# Patient Record
Sex: Female | Born: 1960 | Race: Asian | Hispanic: No | Marital: Married | Smoking: Never smoker
Health system: Southern US, Community
[De-identification: ages and names within clinical notes are randomized; demographics above are authoritative.]

## PROBLEM LIST (undated history)

## (undated) ENCOUNTER — Emergency Department (HOSPITAL_COMMUNITY): Admission: EM | Payer: Self-pay | Source: Home / Self Care

## (undated) DIAGNOSIS — R109 Unspecified abdominal pain: Secondary | ICD-10-CM

## (undated) DIAGNOSIS — D649 Anemia, unspecified: Secondary | ICD-10-CM

## (undated) DIAGNOSIS — R7301 Impaired fasting glucose: Secondary | ICD-10-CM

## (undated) DIAGNOSIS — R0602 Shortness of breath: Secondary | ICD-10-CM

## (undated) DIAGNOSIS — G8929 Other chronic pain: Secondary | ICD-10-CM

## (undated) DIAGNOSIS — E785 Hyperlipidemia, unspecified: Secondary | ICD-10-CM

## (undated) DIAGNOSIS — Z01419 Encounter for gynecological examination (general) (routine) without abnormal findings: Secondary | ICD-10-CM

## (undated) HISTORY — DX: Impaired fasting glucose: R73.01

## (undated) HISTORY — DX: Other chronic pain: G89.29

## (undated) HISTORY — DX: Hyperlipidemia, unspecified: E78.5

## (undated) HISTORY — DX: Anemia, unspecified: D64.9

## (undated) HISTORY — DX: Unspecified abdominal pain: R10.9

## (undated) HISTORY — DX: Encounter for gynecological examination (general) (routine) without abnormal findings: Z01.419

## (undated) HISTORY — DX: Shortness of breath: R06.02

---

## 2003-06-03 ENCOUNTER — Emergency Department (HOSPITAL_COMMUNITY): Admission: EM | Admit: 2003-06-03 | Discharge: 2003-06-03 | Payer: Self-pay | Admitting: Emergency Medicine

## 2003-10-19 ENCOUNTER — Emergency Department (HOSPITAL_COMMUNITY): Admission: EM | Admit: 2003-10-19 | Discharge: 2003-10-19 | Payer: Self-pay | Admitting: Emergency Medicine

## 2003-10-31 ENCOUNTER — Emergency Department (HOSPITAL_COMMUNITY): Admission: EM | Admit: 2003-10-31 | Discharge: 2003-10-31 | Payer: Self-pay | Admitting: Emergency Medicine

## 2005-03-26 ENCOUNTER — Other Ambulatory Visit: Admission: RE | Admit: 2005-03-26 | Discharge: 2005-03-26 | Payer: Self-pay | Admitting: Internal Medicine

## 2005-12-12 ENCOUNTER — Emergency Department (HOSPITAL_COMMUNITY): Admission: EM | Admit: 2005-12-12 | Discharge: 2005-12-12 | Payer: Self-pay | Admitting: Emergency Medicine

## 2007-07-07 ENCOUNTER — Emergency Department (HOSPITAL_COMMUNITY): Admission: EM | Admit: 2007-07-07 | Discharge: 2007-07-07 | Payer: Self-pay | Admitting: Emergency Medicine

## 2007-07-11 ENCOUNTER — Emergency Department (HOSPITAL_COMMUNITY): Admission: EM | Admit: 2007-07-11 | Discharge: 2007-07-11 | Payer: Self-pay | Admitting: Emergency Medicine

## 2008-05-22 ENCOUNTER — Emergency Department (HOSPITAL_COMMUNITY): Admission: EM | Admit: 2008-05-22 | Discharge: 2008-05-22 | Payer: Self-pay | Admitting: Emergency Medicine

## 2008-10-23 ENCOUNTER — Emergency Department (HOSPITAL_COMMUNITY): Admission: EM | Admit: 2008-10-23 | Discharge: 2008-10-24 | Payer: Self-pay | Admitting: Emergency Medicine

## 2010-01-24 ENCOUNTER — Emergency Department (HOSPITAL_COMMUNITY)
Admission: EM | Admit: 2010-01-24 | Discharge: 2010-01-24 | Payer: Self-pay | Source: Home / Self Care | Admitting: Emergency Medicine

## 2010-06-09 LAB — COMPREHENSIVE METABOLIC PANEL
Albumin: 3.6 g/dL (ref 3.5–5.2)
BUN: 16 mg/dL (ref 6–23)
Calcium: 8.9 mg/dL (ref 8.4–10.5)
Creatinine, Ser: 0.66 mg/dL (ref 0.4–1.2)
Potassium: 3.7 mEq/L (ref 3.5–5.1)
Total Protein: 7.5 g/dL (ref 6.0–8.3)

## 2010-06-09 LAB — CBC
HCT: 36.7 % (ref 36.0–46.0)
MCHC: 34.4 g/dL (ref 30.0–36.0)
MCV: 83.5 fL (ref 78.0–100.0)
Platelets: 257 10*3/uL (ref 150–400)
RDW: 14.3 % (ref 11.5–15.5)

## 2010-06-09 LAB — URINE MICROSCOPIC-ADD ON

## 2010-06-09 LAB — DIFFERENTIAL
Lymphocytes Relative: 40 % (ref 12–46)
Lymphs Abs: 3.9 10*3/uL (ref 0.7–4.0)
Monocytes Absolute: 0.5 10*3/uL (ref 0.1–1.0)
Monocytes Relative: 5 % (ref 3–12)
Neutro Abs: 4.8 10*3/uL (ref 1.7–7.7)

## 2010-06-09 LAB — URINALYSIS, ROUTINE W REFLEX MICROSCOPIC
Specific Gravity, Urine: 1.029 (ref 1.005–1.030)
Urobilinogen, UA: 0.2 mg/dL (ref 0.0–1.0)

## 2010-06-09 LAB — WET PREP, GENITAL
Trich, Wet Prep: NONE SEEN
Yeast Wet Prep HPF POC: NONE SEEN

## 2010-06-28 ENCOUNTER — Institutional Professional Consult (permissible substitution): Payer: Self-pay | Admitting: Internal Medicine

## 2010-07-22 ENCOUNTER — Emergency Department (HOSPITAL_COMMUNITY)
Admission: EM | Admit: 2010-07-22 | Discharge: 2010-07-22 | Disposition: A | Discharge status: Home / Self Care | Payer: Commercial Managed Care - PPO | Attending: Emergency Medicine | Admitting: Emergency Medicine

## 2010-07-22 DIAGNOSIS — R059 Cough, unspecified: Secondary | ICD-10-CM | POA: Insufficient documentation

## 2010-07-22 DIAGNOSIS — K219 Gastro-esophageal reflux disease without esophagitis: Secondary | ICD-10-CM | POA: Insufficient documentation

## 2010-07-22 DIAGNOSIS — R1013 Epigastric pain: Secondary | ICD-10-CM | POA: Insufficient documentation

## 2010-07-22 DIAGNOSIS — R05 Cough: Secondary | ICD-10-CM | POA: Insufficient documentation

## 2010-07-22 LAB — URINALYSIS, ROUTINE W REFLEX MICROSCOPIC
Bilirubin Urine: NEGATIVE
Glucose, UA: NEGATIVE mg/dL
Ketones, ur: NEGATIVE mg/dL
Nitrite: NEGATIVE
Protein, ur: NEGATIVE mg/dL
Specific Gravity, Urine: 1.012 (ref 1.005–1.030)
Urobilinogen, UA: 0.2 mg/dL (ref 0.0–1.0)
pH: 7 (ref 5.0–8.0)

## 2010-07-22 LAB — URINE MICROSCOPIC-ADD ON

## 2010-07-23 ENCOUNTER — Encounter: Payer: Self-pay | Admitting: Pulmonary Disease

## 2010-07-31 ENCOUNTER — Encounter: Payer: Self-pay | Admitting: Pulmonary Disease

## 2010-07-31 ENCOUNTER — Ambulatory Visit (INDEPENDENT_AMBULATORY_CARE_PROVIDER_SITE_OTHER): Payer: Commercial Managed Care - PPO | Admitting: Pulmonary Disease

## 2010-07-31 VITALS — BP 110/78 | HR 84 | Temp 98.4°F | Ht 62.0 in | Wt 150.4 lb

## 2010-07-31 DIAGNOSIS — R042 Hemoptysis: Secondary | ICD-10-CM | POA: Insufficient documentation

## 2010-07-31 NOTE — Patient Instructions (Signed)
Would like you to monitor your coughing up blood over the next 2 weeks.  Write down each day that you cough up blood, and call me at the end of two weeks to report back.  We can then make a decision about where to go from here. Please call me sooner than 2 weeks if you cough up increasing quantities of blood.

## 2010-07-31 NOTE — Progress Notes (Signed)
  Subjective:    Patient ID: Michelle Barr, female    DOB: 09/22/1960, 50 y.o.   MRN: [id number]  HPI The pt is a 50y/o asian female who I have been asked to see for hemoptysis.  This started approx 2mos ago, and consists of dark red blood that is quarter sized at its worse.  She is unsure if this is getting less frequent or not.  She describes a fullness developing in her throat, and then she coughs up the blood.  She is unsure if she has postnasal drip, but denies epistaxis.  She has had a negative cxr, and her PPD recently was negative as well.   She denies any worsening sob, has good appetite, and has not had weight loss.  She did have a fall a few mos ago where she hit her sternum on a gate.  ?when the hemoptysis started?.     Review of Systems  Constitutional: Negative for fever and unexpected weight change.  HENT: Positive for nosebleeds, sore throat and dental problem. Negative for ear pain, congestion, rhinorrhea, sneezing, trouble swallowing, postnasal drip and sinus pressure.   Eyes: Negative for redness and itching.  Respiratory: Positive for cough and shortness of breath. Negative for chest tightness and wheezing.   Cardiovascular: Positive for chest pain. Negative for palpitations and leg swelling.  Gastrointestinal: Negative for nausea and vomiting.  Genitourinary: Negative for dysuria.  Musculoskeletal: Negative for joint swelling.  Skin: Negative for rash.  Neurological: Negative for headaches.  Hematological: Does not bruise/bleed easily.  Psychiatric/Behavioral: Negative for dysphoric mood. The patient is not nervous/anxious.        Objective:   Physical Exam Constitutional:  Well developed, no acute distress  HENT:  Nares patent without discharge, no bleeding source noted.   Oropharynx without exudate, palate and uvula are normal, no bleeding source  Eyes:  Perrla, eomi, no scleral icterus  Neck:  No JVD, no TMG  Cardiovascular:  Normal rate, regular rhythm, no rubs or  gallops.  No murmurs        Intact distal pulses  Pulmonary :  Normal breath sounds, no stridor or respiratory distress   No rales, rhonchi, or wheezing  Abdominal:  Soft, nondistended, bowel sounds present.  No tenderness noted.   Musculoskeletal:  No lower extremity edema noted.  Lymph Nodes:  No cervical lymphadenopathy noted  Skin:  No cyanosis noted  Neurologic:  Alert, appropriate, moves all 4 extremities without obvious deficit.         Assessment & Plan:

## 2010-07-31 NOTE — Assessment & Plan Note (Addendum)
The pt has has hemoptysis of dark red blood in varying quantities and on various days for the last 2 mos.  By her description, this is likely coming from the upper rather than lower airway.  She is also in a low risk group with clear cxr, negative PPD, and never smoker.  She did have a fall, which raises the question of pulmonary contusion, but her cxr was not abnormal.  At this point, would like to monitor her for another few weeks to see if this is getting better or not.  She is not able to understand this concept due to language barrier.  She will keep a diary for me.  If continues to be an issue, would check HRCT and do ENT upper airway evaluation.  The pt is agreeable to this approach.

## 2010-11-09 ENCOUNTER — Emergency Department (HOSPITAL_COMMUNITY): Payer: Commercial Managed Care - PPO

## 2010-11-09 ENCOUNTER — Emergency Department (HOSPITAL_COMMUNITY)
Admission: EM | Admit: 2010-11-09 | Discharge: 2010-11-09 | Disposition: A | Discharge status: Home / Self Care | Payer: Commercial Managed Care - PPO | Attending: Emergency Medicine | Admitting: Emergency Medicine

## 2010-11-09 DIAGNOSIS — R61 Generalized hyperhidrosis: Secondary | ICD-10-CM | POA: Insufficient documentation

## 2010-11-09 DIAGNOSIS — R55 Syncope and collapse: Secondary | ICD-10-CM | POA: Insufficient documentation

## 2010-11-09 DIAGNOSIS — R42 Dizziness and giddiness: Secondary | ICD-10-CM | POA: Insufficient documentation

## 2010-11-09 DIAGNOSIS — R232 Flushing: Secondary | ICD-10-CM | POA: Insufficient documentation

## 2010-11-09 DIAGNOSIS — R5381 Other malaise: Secondary | ICD-10-CM | POA: Insufficient documentation

## 2010-11-09 DIAGNOSIS — R1032 Left lower quadrant pain: Secondary | ICD-10-CM | POA: Insufficient documentation

## 2010-11-09 LAB — COMPREHENSIVE METABOLIC PANEL
ALT: 12 U/L (ref 0–35)
AST: 18 U/L (ref 0–37)
Albumin: 3.5 g/dL (ref 3.5–5.2)
CO2: 30 mEq/L (ref 19–32)
Calcium: 9.1 mg/dL (ref 8.4–10.5)
Creatinine, Ser: 0.59 mg/dL (ref 0.50–1.10)
GFR calc non Af Amer: >60 mL/min (ref >60)
Sodium: 139 mEq/L (ref 135–145)
Total Protein: 7.2 g/dL (ref 6.0–8.3)

## 2010-11-09 LAB — DIFFERENTIAL
Basophils Absolute: 0 10*3/uL (ref 0.0–0.1)
Eosinophils Relative: 3 % (ref 0–5)
Lymphocytes Relative: 30 % (ref 12–46)
Lymphs Abs: 2.4 10*3/uL (ref 0.7–4.0)
Monocytes Absolute: 0.4 10*3/uL (ref 0.1–1.0)
Neutro Abs: 5.1 10*3/uL (ref 1.7–7.7)

## 2010-11-09 LAB — URINALYSIS, ROUTINE W REFLEX MICROSCOPIC
Bilirubin Urine: NEGATIVE
Glucose, UA: NEGATIVE mg/dL
Specific Gravity, Urine: 1.023 (ref 1.005–1.030)
Urobilinogen, UA: 0.2 mg/dL (ref 0.0–1.0)
pH: 6 (ref 5.0–8.0)

## 2010-11-09 LAB — URINE MICROSCOPIC-ADD ON

## 2010-11-09 LAB — CBC
HCT: 35.3 % — ABNORMAL LOW (ref 36.0–46.0)
Hemoglobin: 12.2 g/dL (ref 12.0–15.0)
MCV: 81.3 fL (ref 78.0–100.0)
RDW: 13.4 % (ref 11.5–15.5)
WBC: 8.1 10*3/uL (ref 4.0–10.5)

## 2010-11-09 LAB — POCT I-STAT TROPONIN I

## 2010-11-09 MED ORDER — IOHEXOL 300 MG/ML  SOLN
80.0000 mL | Freq: Once | INTRAMUSCULAR | Status: AC | PRN
  Administered 2010-11-09: 80 mL via INTRAVENOUS

## 2012-07-01 ENCOUNTER — Emergency Department (HOSPITAL_COMMUNITY)
Admission: EM | Admit: 2012-07-01 | Discharge: 2012-07-01 | Disposition: A | Discharge status: Home / Self Care | Payer: Commercial Managed Care - PPO | Attending: Emergency Medicine | Admitting: Emergency Medicine

## 2012-07-01 ENCOUNTER — Encounter (HOSPITAL_COMMUNITY): Payer: Self-pay | Admitting: *Deleted

## 2012-07-01 ENCOUNTER — Emergency Department (HOSPITAL_COMMUNITY): Payer: Commercial Managed Care - PPO

## 2012-07-01 DIAGNOSIS — R079 Chest pain, unspecified: Secondary | ICD-10-CM

## 2012-07-01 LAB — CBC WITH DIFFERENTIAL/PLATELET
Basophils Relative: 0 % (ref 0–1)
Eosinophils Absolute: 0.4 10*3/uL (ref 0.0–0.7)
Hemoglobin: 12.8 g/dL (ref 12.0–15.0)
Lymphs Abs: 3 10*3/uL (ref 0.7–4.0)
MCH: 28.1 pg (ref 26.0–34.0)
Monocytes Relative: 6 % (ref 3–12)
Neutro Abs: 4 10*3/uL (ref 1.7–7.7)
Neutrophils Relative %: 51 % (ref 43–77)
Platelets: 268 10*3/uL (ref 150–400)
RBC: 4.55 MIL/uL (ref 3.87–5.11)
WBC: 7.8 10*3/uL (ref 4.0–10.5)

## 2012-07-01 LAB — COMPREHENSIVE METABOLIC PANEL
ALT: 18 U/L (ref 0–35)
Albumin: 3.2 g/dL — ABNORMAL LOW (ref 3.5–5.2)
Alkaline Phosphatase: 63 U/L (ref 39–117)
BUN: 15 mg/dL (ref 6–23)
Chloride: 105 mEq/L (ref 96–112)
Glucose, Bld: 104 mg/dL — ABNORMAL HIGH (ref 70–99)
Potassium: 3.8 mEq/L (ref 3.5–5.1)
Sodium: 139 mEq/L (ref 135–145)
Total Bilirubin: 0.2 mg/dL — ABNORMAL LOW (ref 0.3–1.2)
Total Protein: 7.1 g/dL (ref 6.0–8.3)

## 2012-07-01 LAB — POCT I-STAT TROPONIN I: Troponin i, poc: 0 ng/mL (ref 0.00–0.08)

## 2012-07-01 MED ORDER — FAMOTIDINE 20 MG PO TABS
20.0000 mg | ORAL_TABLET | Freq: Once | ORAL | Status: AC
  Administered 2012-07-01: 20 mg via ORAL
  Filled 2012-07-01: qty 1

## 2012-07-01 MED ORDER — GI COCKTAIL ~~LOC~~
30.0000 mL | Freq: Once | ORAL | Status: AC
  Administered 2012-07-01: 30 mL via ORAL
  Filled 2012-07-01: qty 30

## 2012-07-01 MED ORDER — MORPHINE SULFATE 4 MG/ML IJ SOLN
4.0000 mg | Freq: Once | INTRAMUSCULAR | Status: AC
  Administered 2012-07-01: 4 mg via INTRAVENOUS
  Filled 2012-07-01: qty 1

## 2012-07-01 MED ORDER — ONDANSETRON HCL 4 MG/2ML IJ SOLN
4.0000 mg | Freq: Once | INTRAMUSCULAR | Status: AC
Start: 2012-07-01 — End: 2012-07-01
  Administered 2012-07-01: 4 mg via INTRAVENOUS
  Filled 2012-07-01: qty 2

## 2012-07-01 NOTE — ED Provider Notes (Signed)
History     CSN: [id number]  Arrival date & time 07/01/12  0455   First MD Initiated Contact with Patient 07/01/12 [phone]      Chief Complaint  Patient presents with  . Chest Pain    (Consider location/radiation/quality/duration/timing/severity/associated sxs/prior treatment) Patient is a 52 y.o. female presenting with chest pain. The history is provided by the patient.  Chest Pain Associated symptoms: no abdominal pain, no back pain, no cough, no fever, no headache, no nausea, no palpitations, no shortness of breath and not vomiting   pt c/o lower sternal/midline cp for the past day, occasional feels in back/mid scapular area. Dull. Constant.  Mild. At rest. No relation to activity or exertion. Not positional. No sob. No nv, no diaphoresis. Not pleuritic. No cough or hemoptysis. No fever or chills. No hx heart or lung disease. No fam hx cad. No recent surgery, trauma, immobility/travel. No leg pain or swelling. No hx dvt or pe. No hx gerd or gallstones.     History reviewed. No pertinent past medical history.  History reviewed. No pertinent past surgical history.  Family History  Problem Relation Age of Onset  . Heart disease Father     History  Substance Use Topics  . Smoking status: Never Smoker   . Smokeless tobacco: Not on file  . Alcohol Use: No    OB History   Grav Para Term Preterm Abortions TAB SAB Ect Mult Living                  Review of Systems  Constitutional: Negative for fever and chills.  HENT: Negative for neck pain.   Eyes: Negative for redness.  Respiratory: Negative for cough and shortness of breath.   Cardiovascular: Positive for chest pain. Negative for palpitations and leg swelling.  Gastrointestinal: Negative for nausea, vomiting and abdominal pain.  Genitourinary: Negative for flank pain.  Musculoskeletal: Negative for back pain.  Skin: Negative for rash.  Neurological: Negative for headaches.  Hematological: Does not bruise/bleed easily.   Psychiatric/Behavioral: Negative for confusion.    Allergies  Review of patient's allergies indicates no known allergies.  Home Medications  No current outpatient prescriptions on file.  BP 107/76  Pulse 83  Temp(Src) 98.5 F (36.9 C)  Resp 20  SpO2 97%  Physical Exam  Nursing note and vitals reviewed. Constitutional: She appears well-developed and well-nourished. No distress.  HENT:  Mouth/Throat: Oropharynx is clear and moist.  Eyes: Conjunctivae are normal. No scleral icterus.  Neck: Neck supple. No JVD present. No tracheal deviation present. No thyromegaly present.  Cardiovascular: Normal rate, regular rhythm, normal heart sounds and intact distal pulses.  Exam reveals no gallop and no friction rub.   No murmur heard. Pulmonary/Chest: Effort normal and breath sounds normal. No respiratory distress. She exhibits no tenderness.  Abdominal: Soft. Normal appearance and bowel sounds are normal. She exhibits no distension and no mass. There is no tenderness. There is no rebound and no guarding.  Genitourinary:  No cva tenderness  Musculoskeletal: She exhibits no edema and no tenderness.  Spine non tender.   Neurological: She is alert.  Skin: Skin is warm and dry. No rash noted.  Psychiatric: She has a normal mood and affect.    ED Course  Procedures (including critical care time)  Results for orders placed during the hospital encounter of 07/01/12  CBC WITH DIFFERENTIAL      Result Value Range   WBC 7.8  4.0 - 10.5 K/uL   RBC 4.55  3.87 - 5.11 MIL/uL   Hemoglobin 12.8  12.0 - 15.0 g/dL   HCT 16.1  09.6 - 04.5 %   MCV 82.6  78.0 - 100.0 fL   MCH 28.1  26.0 - 34.0 pg   MCHC 34.0  30.0 - 36.0 g/dL   RDW 40.9  81.1 - 91.4 %   Platelets 268  150 - 400 K/uL   Neutrophils Relative 51  43 - 77 %   Neutro Abs 4.0  1.7 - 7.7 K/uL   Lymphocytes Relative 38  12 - 46 %   Lymphs Abs 3.0  0.7 - 4.0 K/uL   Monocytes Relative 6  3 - 12 %   Monocytes Absolute 0.5  0.1 - 1.0 K/uL    Eosinophils Relative 6 (*) 0 - 5 %   Eosinophils Absolute 0.4  0.0 - 0.7 K/uL   Basophils Relative 0  0 - 1 %   Basophils Absolute 0.0  0.0 - 0.1 K/uL  COMPREHENSIVE METABOLIC PANEL      Result Value Range   Sodium 139  135 - 145 mEq/L   Potassium 3.8  3.5 - 5.1 mEq/L   Chloride 105  96 - 112 mEq/L   CO2 23  19 - 32 mEq/L   Glucose, Bld 104 (*) 70 - 99 mg/dL   BUN 15  6 - 23 mg/dL   Creatinine, Ser 7.82  0.50 - 1.10 mg/dL   Calcium 8.7  8.4 - 95.6 mg/dL   Total Protein 7.1  6.0 - 8.3 g/dL   Albumin 3.2 (*) 3.5 - 5.2 g/dL   AST 18  0 - 37 U/L   ALT 18  0 - 35 U/L   Alkaline Phosphatase 63  39 - 117 U/L   Total Bilirubin 0.2 (*) 0.3 - 1.2 mg/dL   GFR calc non Af Amer >90  >90 mL/min   GFR calc Af Amer >90  >90 mL/min  LIPASE, BLOOD      Result Value Range   Lipase 34  11 - 59 U/L  POCT I-STAT TROPONIN I      Result Value Range   Troponin i, poc 0.00  0.00 - 0.08 ng/mL   Comment 3            Dg Chest 2 View  07/01/2012  *RADIOLOGY REPORT*  Clinical Data: Chest pain.  CHEST - 2 VIEW  Comparison: 11/09/2010.  Findings: No infiltrate, congestive heart failure or pneumothorax.  Heart size within normal limits.  IMPRESSION: No acute abnormality.  Please see above.   Original Report Authenticated By: Lacy Duverney, M.D.    US Abdomen Complete  07/01/2012  *RADIOLOGY REPORT*  Ultrasound  History:  Abdominal pain  Comparison:  CT abdomen 11/09/2010  Findings:  The gallbladder is visualized in multiple projections. There are no gallstones, gallbladder wall thickening, or pericholecystic fluid collection.  There is no intrahepatic, common hepatic, or common bile duct dilatation.  Pancreas is largely obscured by gas.  Portions of the pancreas are visualized and appear normal.  No liver lesions are identified.  Spleen is normal in size and homogeneous in echotexture.  Kidneys bilaterally appear normal.  There is no ascites.  Aorta is nonaneurysmal.  Inferior vena cava appears normal.   Conclusion:  Only portions of the pancreas are visualized due to overlying gas.  Visualized portions of pancreas appear normal.  Study is otherwise unremarkable.   Original Report Authenticated By: Bretta Bang, M.D.       MDM  Iv ns. Labs. Cxr.   Reviewed nursing notes and prior charts for additional history.   Morphine iv. zofran iv.  Recheck no change.  Given pepcid and gi cocktail - relief of symptoms.  After symptoms present, constant, for 1 days time, trop neg.  Recheck pt asymptomatic, no pain, no sob - pt appears stable for d/c.    Date: 07/01/2012  Rate: 94  Rhythm: normal sinus rhythm  QRS Axis: normal  Intervals: PR prolonged  ST/T Wave abnormalities: normal  Conduction Disutrbances:first-degree A-V block   Narrative Interpretation:   Old EKG Reviewed: unchanged          Suzi Roots, MD 07/01/12 1011

## 2012-07-01 NOTE — ED Notes (Signed)
Pt c/o chest pain since yesterday 8pm; previous history of same kind of pain; pt c/o left sided chest pain radiating to back and right side of neck; states short of breath

## 2012-07-06 ENCOUNTER — Ambulatory Visit (INDEPENDENT_AMBULATORY_CARE_PROVIDER_SITE_OTHER): Payer: Commercial Managed Care - PPO | Admitting: Internal Medicine

## 2012-07-06 VITALS — BP 103/75 | HR 93 | Ht 60.0 in | Wt 148.0 lb

## 2012-07-06 DIAGNOSIS — R079 Chest pain, unspecified: Secondary | ICD-10-CM

## 2012-07-06 NOTE — Patient Instructions (Addendum)
Your physician has requested that you have an echocardiogram. Echocardiography is a painless test that uses sound waves to create images of your heart. It provides your doctor with information about the size and shape of your heart and how well your heart's chambers and valves are working. This procedure takes approximately one hour. There are no restrictions for this procedure.   

## 2012-07-06 NOTE — Progress Notes (Signed)
HPI Patient was seen in ER on 4/30 complaining of CP Prior to that it was a couple years ago. Pain began at about 8 PM the night before  Slept some    In AM woke with it.   SOB   Went to ER  Pain went away before leaving the ER. CT and labs were normal SInce d/c from ER she has not had any more chest pain  No Back pain  Walks   Usually works in housekeeping  Was on vactaion last week.  No Known Allergies  No current outpatient prescriptions on file.   No current facility-administered medications for this visit.    No past medical history on file. PMH  Negative  No past surgical history on file.    History   Social History  . Marital Status: Married    Spouse Name: N/A    Number of Children: N/A  . Years of Education: N/A   Occupational History  . house keeper [employer]   Social History Main Topics  . Smoking status: Never Smoker   . Smokeless tobacco: Not on file  . Alcohol Use: No  . Drug Use: Not on file  . Sexually Active: Not on file   Other Topics Concern  . Not on file   Social History Narrative  . No narrative on file    Review of Systems:  All systems reviewed.  They are negative to the above problem except as previously stated.  Vital Signs: BP 103/75  Pulse 93  Ht 5' (1.524 m)  Wt 148 lb (67.132 kg)  BMI 28.9 kg/m2  Physical Exam Patient is in NAD HEENT:  Normocephalic, atraumatic. EOMI, PERRLA.  Neck: JVP is normal.  No bruits.  Lungs: clear to auscultation. No rales no wheezes.  Heart: Regular rate and rhythm. Normal S1, S2. No S3.   No significant murmurs. PMI not displaced.  Abdomen:  Supple, nontender. Normal bowel sounds. No masses. No hepatomegaly.  Extremities:   Good distal pulses throughout. No lower extremity edema.  Musculoskeletal :moving all extremities.  Neuro:   alert and oriented x3.  CN II-XII grossly intact.  EKG:  4/30  SR  SL ST elevation II, III, AVF, V4-V6   Assessment and Plan:  1.  CP/Back pain   I am not sure what caused episode.  She has not had any since though she has not been working  I would recomm that the patient have an echo done to evluate LV and aorta. Keep with activity as tolerated

## 2012-07-09 ENCOUNTER — Other Ambulatory Visit (HOSPITAL_COMMUNITY): Payer: Commercial Managed Care - PPO

## 2012-11-22 ENCOUNTER — Encounter (HOSPITAL_COMMUNITY): Payer: Self-pay | Admitting: *Deleted

## 2012-11-22 ENCOUNTER — Emergency Department (HOSPITAL_COMMUNITY)
Admission: EM | Admit: 2012-11-22 | Discharge: 2012-11-22 | Disposition: A | Discharge status: Home / Self Care | Payer: Commercial Managed Care - PPO | Source: Home / Self Care | Attending: Family Medicine | Admitting: Family Medicine

## 2012-11-22 DIAGNOSIS — N390 Urinary tract infection, site not specified: Secondary | ICD-10-CM

## 2012-11-22 LAB — POCT URINALYSIS DIP (DEVICE)
Bilirubin Urine: NEGATIVE
Glucose, UA: NEGATIVE mg/dL
Specific Gravity, Urine: 1.015 (ref 1.005–1.030)
pH: 7 (ref 5.0–8.0)

## 2012-11-22 MED ORDER — CEPHALEXIN 500 MG PO CAPS: 500.0000 mg | ORAL_CAPSULE | Freq: Three times a day (TID) | ORAL | Status: DC

## 2012-11-22 NOTE — ED Notes (Signed)
Pt  Reports   Frequency  And  Burning  On  Urination  For  sev  Days   With       Pain  When  She  Urinates

## 2012-11-22 NOTE — ED Provider Notes (Signed)
Michelle Barr is a 52 y.o. female who presents to Urgent Care today for dysuria urinary frequency and urgency starting this morning. She has not tried any medications yet. She denies any nausea vomiting diarrhea fevers or chills. She feels well otherwise. No chest pain palpitations shortness of breath. No vaginal discharge. No significant abdominal pain.   History reviewed. No pertinent past medical history. History  Substance Use Topics  . Smoking status: Never Smoker   . Smokeless tobacco: Not on file  . Alcohol Use: No   ROS as above Medications reviewed. No current facility-administered medications for this encounter.   Current Outpatient Prescriptions  Medication Sig Dispense Refill  . cephALEXin (KEFLEX) 500 MG capsule Take 1 capsule (500 mg total) by mouth 3 (three) times daily.  21 capsule  0    Exam:  BP 129/86  Pulse 60  Temp(Src) 98.1 F (36.7 C) (Oral)  Resp 17  SpO2 98% Gen: Well NAD HEENT: EOMI,  MMM Lungs: CTABL Nl WOB Heart: RRR no MRG Abd: NABS, NT, ND, no CV angle tenderness to percussion Exts: Non edematous BL  LE, warm and well perfused.   Results for orders placed during the hospital encounter of 11/22/12 (from the past 24 hour(s))  POCT URINALYSIS DIP (DEVICE)     Status: Abnormal   Collection Time    11/22/12  9:42 AM      Result Value Range   Glucose, UA NEGATIVE  NEGATIVE mg/dL   Bilirubin Urine NEGATIVE  NEGATIVE   Ketones, ur NEGATIVE  NEGATIVE mg/dL   Specific Gravity, Urine 1.015  1.005 - 1.030   Hgb urine dipstick LARGE (*) NEGATIVE   pH 7.0  5.0 - 8.0   Protein, ur NEGATIVE  NEGATIVE mg/dL   Urobilinogen, UA 0.2  0.0 - 1.0 mg/dL   Nitrite NEGATIVE  NEGATIVE   Leukocytes, UA MODERATE (*) NEGATIVE  POCT PREGNANCY, URINE     Status: None   Collection Time    11/22/12  9:48 AM      Result Value Range   Preg Test, Ur NEGATIVE  NEGATIVE   No results found.  Assessment and Plan: 52 y.o. female with urinary tract infection.  Plan to treat  empirically with Keflex Followup as needed Discussed warning signs or symptoms. Please see discharge instructions. Patient expresses understanding.      Rodolph Bong, MD 11/22/12 1016

## 2013-03-31 ENCOUNTER — Ambulatory Visit (INDEPENDENT_AMBULATORY_CARE_PROVIDER_SITE_OTHER): Payer: Commercial Managed Care - PPO | Admitting: Medical

## 2013-03-31 ENCOUNTER — Encounter: Payer: Self-pay | Admitting: Medical

## 2013-03-31 VITALS — BP 110/80 | HR 68 | Temp 98.0°F | Resp 16 | Ht 61.5 in | Wt 151.0 lb

## 2013-03-31 DIAGNOSIS — J329 Chronic sinusitis, unspecified: Secondary | ICD-10-CM

## 2013-03-31 DIAGNOSIS — Z1239 Encounter for other screening for malignant neoplasm of breast: Secondary | ICD-10-CM

## 2013-03-31 DIAGNOSIS — R079 Chest pain, unspecified: Secondary | ICD-10-CM

## 2013-03-31 DIAGNOSIS — Z23 Encounter for immunization: Secondary | ICD-10-CM

## 2013-03-31 DIAGNOSIS — R040 Epistaxis: Secondary | ICD-10-CM

## 2013-03-31 DIAGNOSIS — R109 Unspecified abdominal pain: Secondary | ICD-10-CM

## 2013-03-31 DIAGNOSIS — R7301 Impaired fasting glucose: Secondary | ICD-10-CM

## 2013-03-31 DIAGNOSIS — Z0000 Encounter for general adult medical examination without abnormal findings: Secondary | ICD-10-CM

## 2013-03-31 LAB — CBC
HEMATOCRIT: 39.1 % (ref 36.0–46.0)
HEMOGLOBIN: 13.2 g/dL (ref 12.0–15.0)
MCH: 28 pg (ref 26.0–34.0)
MCHC: 33.8 g/dL (ref 30.0–36.0)
MCV: 83 fL (ref 78.0–100.0)
Platelets: 296 10*3/uL (ref 150–400)
RBC: 4.71 MIL/uL (ref 3.87–5.11)
RDW: 14.6 % (ref 11.5–15.5)
WBC: 6.9 10*3/uL (ref 4.0–10.5)

## 2013-03-31 LAB — LIPID PANEL
CHOL/HDL RATIO: 4 ratio
Cholesterol: 206 mg/dL — ABNORMAL HIGH (ref 0–200)
HDL: 52 mg/dL (ref >39)
LDL CALC: 124 mg/dL — AB (ref 0–99)
TRIGLYCERIDES: 148 mg/dL (ref <150)
VLDL: 30 mg/dL (ref 0–40)

## 2013-03-31 LAB — POCT URINALYSIS DIPSTICK
Bilirubin, UA: NEGATIVE
Glucose, UA: NEGATIVE
Ketones, UA: NEGATIVE
Leukocytes, UA: NEGATIVE
NITRITE UA: NEGATIVE
PH UA: 5
PROTEIN UA: NEGATIVE
RBC UA: NEGATIVE
Spec Grav, UA: <=1.005
UROBILINOGEN UA: NEGATIVE

## 2013-03-31 LAB — COMPREHENSIVE METABOLIC PANEL
ALK PHOS: 53 U/L (ref 39–117)
ALT: 17 U/L (ref 0–35)
AST: 20 U/L (ref 0–37)
Albumin: 4.2 g/dL (ref 3.5–5.2)
BUN: 12 mg/dL (ref 6–23)
CO2: 25 mEq/L (ref 19–32)
CREATININE: 0.51 mg/dL (ref 0.50–1.10)
Calcium: 9.2 mg/dL (ref 8.4–10.5)
Chloride: 105 mEq/L (ref 96–112)
Glucose, Bld: 92 mg/dL (ref 70–99)
Potassium: 4.1 mEq/L (ref 3.5–5.3)
Sodium: 139 mEq/L (ref 135–145)
Total Bilirubin: 0.4 mg/dL (ref 0.2–1.2)
Total Protein: 7.5 g/dL (ref 6.0–8.3)

## 2013-03-31 LAB — HEMOGLOBIN A1C
Hgb A1c MFr Bld: 5.6 % (ref <5.7)
MEAN PLASMA GLUCOSE: 114 mg/dL (ref <117)

## 2013-03-31 MED ORDER — OMEPRAZOLE 40 MG PO CPDR: 40.0000 mg | DELAYED_RELEASE_CAPSULE | Freq: Every day | ORAL | Status: DC

## 2013-03-31 MED ORDER — AMOXICILLIN 875 MG PO TABS: 875.0000 mg | ORAL_TABLET | Freq: Two times a day (BID) | ORAL | Status: DC

## 2013-03-31 NOTE — Progress Notes (Signed)
Subjective:   HPI  Michelle Barr is a 53 y.o. female who presents for a complete physical.  She is a new patient today.  She is accompanied by her husband and the [place]. She speaks limited [place].  She is originally from [place].  Of note, there was some difficulty with the language barrier today.  Based on our conversation she has not had routine preventative care, and in her culture this isn't a routine thing that is done.  She typically only seeks medical care when she is in pain or has a problem.  She reports no history of mammogram, colonoscopy.  She thinks she may have had a Pap smear 7 years ago.    Preventative care: Last ophthalmology visit:YES 4 TO 5 YEARS AGO Last dental visit:N/A Last colonoscopy:NEVER Last mammogram:NEVER Last gynecological exam:YEARS AGO Last EC:[id number] LONG Last labs:N/A  Prior vaccinations: TD or Tdap:? Influenza:NO Pneumococcal:N/A Shingles/Zostavax:N/A  Advanced directive:N/A Health care power of attorney:N/A Living will:N/A  Concerns: She notes recent nosebleeds, intermittent.  Gets some sinus pressure, yellow discharge.  Doing some nasal saline.  Sometimes blood in mucous.    Has ongoing chest and abdominal pain.  Has seen ED for both.   No gynecologist.  Saw cardiology last year for same.    Reviewed their medical, surgical, family, social, medication, and allergy history and updated chart as appropriate.  History reviewed. No pertinent past medical history.  History reviewed. No pertinent past surgical history.  History   Social History  . Marital Status: Married    Spouse Name: N/A    Number of Children: N/A  . Years of Education: N/A   Occupational History  . house keeper [employer]   Social History Main Topics  . Smoking status: Never Smoker   . Smokeless tobacco: Not on file  . Alcohol Use: 0.6 oz/week    1 Glasses of wine per week  . Drug Use: No  . Sexual Activity: Not on file   Other Topics  Concern  . 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 Heart disease Father     Current outpatient prescriptions:amoxicillin (AMOXIL) 875 MG tablet, Take 1 tablet (875 mg total) by mouth 2 (two) times daily., Disp: 20 tablet, Rfl: 0;  omeprazole (PRILOSEC) 40 MG capsule, Take 1 capsule (40 mg total) by mouth daily., Disp: 30 capsule, Rfl: 3  No Known Allergies    Review of Systems Constitutional: -fever, -chills, -sweats, -unexpected weight change, -decreased appetite, -fatigue Allergy: -sneezing, -itching, -congestion Dermatology: -changing moles, --rash, -lumps ENT: -runny nose, -ear pain, -sore throat, -hoarseness, -sinus pain, -teeth pain, - ringing in ears, -hearing loss, -nosebleeds Cardiology: +chest pain, -palpitations, -swelling, -difficulty breathing when lying flat, -waking up short of breath Respiratory: -cough, -shortness of breath, -difficulty breathing with exercise or exertion, -wheezing, -coughing up blood Gastroenterology: +abdominal pain, -nausea, -vomiting, -diarrhea, -constipation, -blood in stool, -changes in bowel movement, -difficulty swallowing or eating Hematology: -bleeding, -bruising  Musculoskeletal: -joint aches, -muscle aches, -joint swelling, -back pain, -neck pain, -cramping, -changes in gait Ophthalmology: denies vision changes, eye redness, itching, discharge Urology: -burning with urination, -difficulty urinating, -blood in urine, -urinary frequency, -urgency, -incontinence Neurology: +headache, -weakness, -tingling, -numbness, -memory loss, -falls, -dizziness Psychology: -depressed mood, -agitation, -sleep problems     Objective:   Physical Exam  BP 110/80  Pulse 68  Temp(Src) 98 F (36.7  C) (Oral)  Resp 16  Ht 5' 1.5" (1.562 m)  Wt 151 lb (68.493 kg)  BMI 28.07 kg/m2  General appearance: alert, no distress, WD/WN, viet[place]  female Skin: She was mostly clothed during today's exam, so unfortunately was not able to do a complete skin exam. However no obvious worrisome lesions HEENT: normocephalic, conjunctiva/corneas normal, sclerae anicteric, PERRLA, EOMi, nares with turbinate edema, slight dry blood discharge in the right nare, mucoid discharge bilat, pharynx normal Oral cavity: MMM, tongue normal, missing some of her molars upper, the rest of the teeth in good repair Neck: supple, no lymphadenopathy, no thyromegaly, no masses, normal ROM, no bruits Chest: non tender, normal shape and expansion Heart: RRR, normal S1, S2, no murmurs Lungs: CTA bilaterally, no wheezes, rhonchi, or rales Abdomen: +bs, soft, mild suprapubic tenderness, otherwise non tender, non distended, no masses, no hepatomegaly, no splenomegaly, no bruits Back: non tender, normal ROM, no scoliosis Musculoskeletal: upper extremities non tender, no obvious deformity, normal ROM throughout, lower extremities non tender, no obvious deformity, normal ROM throughout Extremities: no edema, no cyanosis, no clubbing Pulses: 2+ symmetric, upper and lower extremities, normal cap refill Neurological: alert, oriented x 3, CN2-12 intact, strength normal upper extremities and lower extremities, sensation normal throughout, DTRs 2+ throughout, no cerebellar signs, gait normal Psychiatric: normal affect, behavior normal, pleasant  Breast/gyn/rectal - deferred to gynecology   Adult ECG Report  Indication: chest pain  Rate: 62bpm  Rhythm: normal sinus rhythm  QRS Axis: 54 degrees  PR Interval: 132ms  QRS Duration: 82ms   QTc: 466ms  Conduction Disturbances: none  Other Abnormalities: none  Patient's cardiac risk factors are: sedentary lifestyle.  EKG comparison: none  Narrative Interpretation: normal EKG     Assessment and Plan :    Encounter Diagnoses  Name Primary?  . Routine general medical examination at a health care facility Yes  . Epistaxis    . Abdominal pain, unspecified site   . Chest pain   . Sinusitis   . Impaired fasting blood sugar   . Need for Tdap vaccination   . Screening for breast cancer      Physical exam - discussed healthy lifestyle, diet, exercise, preventative care, vaccinations, and addressed their concerns.  Handout given.  Epistaxis-likely related to dry air, they do use gas and wood heat.   Recommended nasal saline, can use coating of Vaseline inside the nostrils. If this continues, may need to see ENT  Sinusitis-it was hard to get the right history today, but based on our back and forth conversation she may have a sinus infection and this could be possibly related to some of the bloody discharge she sees.  Begin course of amoxicillin, continue nasal saline flush  Abdominal pain-based on history, she has no GU or gastric symptoms, however she keeps mentioning something may be wrong with her colon, possible gas.  The translator did not seem to be able to shed any further light on this. She denied any other symptoms suggestive of diarrhea, constipation, blood in stool, bloating. She does eat a lot of spicy food, does report vague chest pain as well.  I recommended a trial of avoiding GERD triggers, avoid spicy foods, don't eat within 2 hours at bedtime, begin trial of omeprazole. Recheck in 2 weeks  Chest pain-reviewed EKG today, reviewed cardiology notes from May 2014. She was supposed to have followed up with echocardiogram but never went. I gave them the contact information for the cardiologist she saw, and recommend she followup  and have the echocardiogram  Counseled on the Tdap (tetanus, diptheria, and acellular pertussis) vaccine.  Vaccine information sheet given. Tdap vaccine given after consent obtained  Prior labs in the system show elevated glucose.  HgbA1C today  She agreed to referral to gynecology for breast and pelvic exam, further evaluation of lower abdominal/pelvic pain  Due to the length of  time it took to complete this visit, we just decided to defer discussion of colonoscopy to a later date  Follow-up pending labs, referral

## 2013-03-31 NOTE — Patient Instructions (Signed)
Begin amoxicillin antibiotic for possible sinus infection. This is twice a day for 10 days with food.  She can use nasal saline flush that she was mentioning to clean out the sinuses .  For the next 1-2 weeks have her take over-the-counter Benadryl at bedtime for the sinus congestion.  She is having nosebleeds. I would like her to put a coating of Vaseline in both nostrils with a Q-tip daily for right now. See if this helps with nosebleeds. If she continues to get nosebleeds regularly, we may need to have her see an ear nose and throat doctor.  We will refer her to a gynecologist which is a female doctor.  This is for breast and pelvic exam as well as trying to help look at her lower belly pain  Your urine did not show any infection today.  Let's have her start omeprazole once daily in the morning, 30 minutes before breakfast.  This is to help with stomach acid, chest/abdomen pain.     She needs to follow up with Dr. Ross/cardiology regarding chest pain and echocardiogram.

## 2013-04-02 ENCOUNTER — Encounter: Payer: Self-pay | Admitting: Medical

## 2013-04-02 ENCOUNTER — Ambulatory Visit (INDEPENDENT_AMBULATORY_CARE_PROVIDER_SITE_OTHER): Payer: Commercial Managed Care - PPO | Admitting: Medical

## 2013-04-02 VITALS — BP 110/80 | HR 72 | Temp 98.3°F | Resp 16 | Wt 154.0 lb

## 2013-04-02 DIAGNOSIS — R6883 Chills (without fever): Secondary | ICD-10-CM

## 2013-04-02 DIAGNOSIS — R51 Headache: Secondary | ICD-10-CM

## 2013-04-02 DIAGNOSIS — J3489 Other specified disorders of nose and nasal sinuses: Secondary | ICD-10-CM

## 2013-04-02 LAB — POC INFLUENZA A&B (BINAX/QUICKVUE)
Influenza A, POC: NEGATIVE
Influenza B, POC: NEGATIVE

## 2013-04-02 MED ORDER — HYDROCODONE-IBUPROFEN 5-200 MG PO TABS: 1.0000 | ORAL_TABLET | Freq: Three times a day (TID) | ORAL | Status: DC | PRN

## 2013-04-02 NOTE — Progress Notes (Signed)
Subjective: Here for headache.  I just saw her days ago for a new patient physical.  Here with her son who translates today, although the conversation and translation was difficult.   Son speaks [place] and [place]etnamese but the translation was still difficult.  Her symptoms appear to be ongoing headache, last night had cough and chills, maybe felt feverish but not sure, still has the nasal congestion.  No other symptoms.  Denies body aches, sore throat, ear pain, nausea or vomiting, numbness or tingling, vision loss, or other symptoms.  No past medical history on file.  ROS as in subjective  Objective: Filed Vitals:   04/02/13 1111  BP: 110/80  Pulse: 72  Temp: 98.3 F (36.8 C)  Resp: 16    General appearance: alert, no distress, WD/WN HEENT: normocephalic, sclerae anicteric, conjunctiva pink and moist, TMs pearly, nares patent, clear discharge and erythema, pharynx normal, tonsils unremarkable Oral cavity: MMM, no lesions Neck: supple, no lymphadenopathy, no thyromegaly, no masses Heart: RRR, normal S1, S2, no murmurs Lungs: CTA bilaterally, no wheezes, rhonchi, or rales Pulses: 2+ symmetric   Assessment and Plan: Encounter Diagnoses  Name Primary?  . Headache(784.0) Yes  . Chills   . Sinus pressure    Flu test negative.  I advise she continue the amoxicillin, give this more time to treat possible sinusitis, can use a prescription for Vicoprofen for pain Q8 hours, but do not take this and go to work.  Rest, hydrate well, and call back Monday if no improvement.  Patient and son seemed to understand and be agreeable with the plan.  Follow-up prn.

## 2013-04-09 ENCOUNTER — Encounter: Payer: Self-pay | Admitting: Certified Nurse Midwife

## 2013-04-09 ENCOUNTER — Ambulatory Visit (INDEPENDENT_AMBULATORY_CARE_PROVIDER_SITE_OTHER): Payer: Commercial Managed Care - PPO | Admitting: Certified Nurse Midwife

## 2013-04-09 VITALS — BP 110/70 | HR 68 | Resp 16 | Ht 60.75 in | Wt 156.0 lb

## 2013-04-09 DIAGNOSIS — Z01419 Encounter for gynecological examination (general) (routine) without abnormal findings: Secondary | ICD-10-CM

## 2013-04-09 DIAGNOSIS — Z0000 Encounter for general adult medical examination without abnormal findings: Secondary | ICD-10-CM

## 2013-04-09 NOTE — Progress Notes (Signed)
53 y.o. G60P2002 Married [place] here for annual exam. Patient speaks and understands English fairly well. 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 She as two sons, 25, 68. 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 Patient stopped having periods approximately seven years. She relates she had hot flashes and night sweats but they stopped ?3 years ago. Denies any "Lady problems except for vaginal dryness when attempts sexual activity.Denies vaginal bleeding since menopausal. No other health issues today.  Patient's last menstrual period was 03/04/2006.          Sexually active: no  The current method of family planning is post menopausal status.    Exercising: yes  walk Smoker:  no  Health Maintenance: Pap:  2008 no abnormal per patient MMG:  none Colonoscopy: to be scheduled with PCP BMD:   none TDaP:  03-31-13 Flu shot:10/14 Labs: done with pcp Self breast exam: does them   reports that she has never smoked. She does not have any smokeless tobacco history on file. She reports that she does not drink alcohol or use illicit drugs.  Past Medical History  Diagnosis Date  . Anemia 10 years ago    no medication    History reviewed. No pertinent past surgical history.  Current Outpatient Prescriptions  Medication Sig Dispense Refill  . amoxicillin (AMOXIL) 875 MG tablet Take 1 tablet (875 mg total) by mouth 2 (two) times daily.  20 tablet  0  . hydrocodone-ibuprofen (VICOPROFEN) 5-200 MG per tablet Take 1 tablet by mouth every 8 (eight) hours as needed for pain.  20 tablet  0  . omeprazole (PRILOSEC) 40 MG capsule Take 1 capsule (40 mg total) by mouth daily.  30 capsule  3   No current facility-administered medications for this visit.    History reviewed. No pertinent family history.  ROS:  Pertinent items are noted in HPI.   Otherwise, a comprehensive ROS was negative.  Exam:   BP 110/70  Pulse 68  Resp 16  Ht 5' 0.75" (1.543 m)  Wt 156 lb (70.761 kg)  BMI 29.72 kg/m2  LMP 03/04/2006 Height: 5' 0.75" (154.3 cm)  Ht Readings from Last 3 Encounters:  04/09/13 5' 0.75" (1.543 m)  03/31/13 5' 1.5" (1.562 m)  07/06/12 5' (1.524 m)    General appearance: alert, cooperative and appears stated age Head: Normocephalic, without obvious abnormality, atraumatic Neck: no adenopathy, supple, symmetrical, trachea midline and thyroid normal to inspection and palpation Lungs: clear to auscultation bilaterally Breasts: normal appearance, no masses or tenderness, No nipple retraction or dimpling, No nipple discharge or bleeding, No axillary or supraclavicular adenopathy Heart: regular rate and rhythm Abdomen: soft, non-tender; no masses,  no organomegaly Extremities: extremities normal, atraumatic, no cyanosis or edema Skin: Skin color, texture, turgor normal. No rashes or lesions Lymph nodes: Cervical, supraclavicular, and axillary nodes normal. No abnormal inguinal nodes palpated Neurologic: Grossly normal   Pelvic: External genitalia:  no lesions              Urethra:  normal appearing urethra with no masses, tenderness or lesions              Bartholin's and Skene's: normal                 Vagina: normal appearing vagina with normal color and slight moisture, no lesions, slight  atrophic appearance              Cervix: normal appearance, non tender              Pap taken: yes Bimanual Exam:  Uterus:  normal size, contour, position, consistency, mobility, non-tender and mid position              Adnexa: normal adnexa and no mass, fullness, tenderness               Rectovaginal: Confirms               Anus:  normal sphincter tone, no lesions  A:  Well Woman with normal exam  Menopausal no HRT  Under evaluation for GI response to medication  Vaginal dryness, with slight atrophic appearance  P:   Reviewed  health and wellness pertinent to exam  Discussed importance of notifying provider if vaginal bleeding , which could indicate problem.  Continue follow up as indicated by PCP with GI   Discussed options for vaginal dryness treatment which will help with sexually activity discomfort. Estrogen,Olive Oil or OTC products. Patient prefers no medications, will try Olive Oil and call if issues.  Pap smear as per guidelines   Mammogram yearly pap smear taken today with HPVHR  counseled on breast self exam, mammography screening, menopause, adequate intake of calcium and vitamin D, diet and exercise return annually or prn  An After Visit Summary was printed and given to the patient.

## 2013-04-09 NOTE — Patient Instructions (Signed)

## 2013-04-13 LAB — IPS PAP TEST WITH HPV

## 2013-04-16 NOTE — Progress Notes (Signed)
Reviewed personally.  M. Suzanne Zeferino Mounts, MD.  

## 2013-06-17 ENCOUNTER — Emergency Department (HOSPITAL_COMMUNITY): Payer: Commercial Managed Care - PPO

## 2013-06-17 ENCOUNTER — Encounter (HOSPITAL_COMMUNITY): Payer: Self-pay | Admitting: Emergency Medicine

## 2013-06-17 ENCOUNTER — Emergency Department (HOSPITAL_COMMUNITY)
Admission: EM | Admit: 2013-06-17 | Discharge: 2013-06-17 | Disposition: A | Discharge status: Home / Self Care | Payer: Commercial Managed Care - PPO | Attending: Emergency Medicine | Admitting: Emergency Medicine

## 2013-06-17 DIAGNOSIS — Z862 Personal history of diseases of the blood and blood-forming organs and certain disorders involving the immune mechanism: Secondary | ICD-10-CM | POA: Insufficient documentation

## 2013-06-17 DIAGNOSIS — M25562 Pain in left knee: Secondary | ICD-10-CM

## 2013-06-17 DIAGNOSIS — M25569 Pain in unspecified knee: Secondary | ICD-10-CM | POA: Insufficient documentation

## 2013-06-17 DIAGNOSIS — Z792 Long term (current) use of antibiotics: Secondary | ICD-10-CM | POA: Insufficient documentation

## 2013-06-17 MED ORDER — MELOXICAM 7.5 MG PO TABS: 15.0000 mg | ORAL_TABLET | Freq: Every day | ORAL | Status: DC

## 2013-06-17 NOTE — ED Notes (Signed)
Patient transported to X-ray 

## 2013-06-17 NOTE — ED Provider Notes (Signed)
Medical screening examination/treatment/procedure(s) were conducted as a shared visit with non-physician practitioner(s) and myself.  I personally evaluated the patient during the encounter.   EKG Interpretation None      Pt c/o left knee pain. Denies injury/fall. Hx same. No hip or ankle pain. No redness or skin changes. No numbness/weakness. No fevers. Good rom without pain, knee stable. Distal pulses palp.   Mirna Mires, MD 06/17/13 [phone]

## 2013-06-17 NOTE — ED Notes (Signed)
Pt is c/o left knee pain  Pt states it started last week but got worse last night
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
## 2013-09-12 ENCOUNTER — Encounter (HOSPITAL_COMMUNITY): Payer: Self-pay | Admitting: Emergency Medicine

## 2013-09-12 ENCOUNTER — Emergency Department (HOSPITAL_COMMUNITY)
Admission: EM | Admit: 2013-09-12 | Discharge: 2013-09-12 | Disposition: A | Discharge status: Home / Self Care | Payer: Commercial Managed Care - PPO | Attending: Emergency Medicine | Admitting: Emergency Medicine

## 2013-09-12 ENCOUNTER — Emergency Department (HOSPITAL_COMMUNITY): Payer: Commercial Managed Care - PPO

## 2013-09-12 DIAGNOSIS — J069 Acute upper respiratory infection, unspecified: Secondary | ICD-10-CM | POA: Insufficient documentation

## 2013-09-12 DIAGNOSIS — Z862 Personal history of diseases of the blood and blood-forming organs and certain disorders involving the immune mechanism: Secondary | ICD-10-CM | POA: Insufficient documentation

## 2013-09-12 DIAGNOSIS — Z79899 Other long term (current) drug therapy: Secondary | ICD-10-CM | POA: Insufficient documentation

## 2013-09-12 MED ORDER — GUAIFENESIN ER 600 MG PO TB12
1200.0000 mg | ORAL_TABLET | Freq: Two times a day (BID) | ORAL | Status: DC
  Administered 2013-09-12: 1200 mg via ORAL
  Filled 2013-09-12: qty 2

## 2013-09-12 MED ORDER — HYDROCOD POLST-CHLORPHEN POLST 10-8 MG/5ML PO LQCR
5.0000 mL | Freq: Two times a day (BID) | ORAL | Status: DC | PRN
Start: 2013-09-12 — End: 2014-01-01

## 2013-09-12 MED ORDER — HYDROCOD POLST-CHLORPHEN POLST 10-8 MG/5ML PO LQCR
5.0000 mL | Freq: Once | ORAL | Status: AC
  Administered 2013-09-12: 5 mL via ORAL
  Filled 2013-09-12: qty 5

## 2013-09-12 MED ORDER — ALBUTEROL SULFATE HFA 108 (90 BASE) MCG/ACT IN AERS
1.0000 | INHALATION_SPRAY | RESPIRATORY_TRACT | Status: DC | PRN
  Administered 2013-09-12: 2 via RESPIRATORY_TRACT
  Filled 2013-09-12: qty 6.7

## 2013-09-12 MED ORDER — GUAIFENESIN ER 600 MG PO TB12: 1200.0000 mg | ORAL_TABLET | Freq: Two times a day (BID) | ORAL | Status: DC

## 2013-09-12 NOTE — ED Notes (Signed)
Pt states she has had difficulty breathing x 2-3 days, pt states her throat feels "tight"

## 2013-09-12 NOTE — ED Provider Notes (Signed)
CSN: [id number]     Arrival date & time 09/12/13  0256 History   First MD Initiated Contact with Patient 09/12/13 0434     Chief Complaint  Patient presents with  . difficulty breathing      (Consider location/radiation/quality/duration/timing/severity/associated sxs/prior Treatment) HPI 53 year old female presents to emergency department with complaint of 2-3 days of shortness of breath, tightness across her upper chest and throat.  She has had cough.  She has had wheezing.  No fevers or chills.  No prior history of asthma.  She is nonsmoker.  No history of COPD no sick contacts. Past Medical History  Diagnosis Date  . Anemia 10 years ago    no medication   History reviewed. No pertinent past surgical history. No family history on file. History  Substance Use Topics  . Smoking status: Never Smoker   . Smokeless tobacco: Not on file  . Alcohol Use: No   OB History   Grav Para Term Preterm Abortions TAB SAB Ect Mult Living   1 1 1       1      Obstetric Comments   1 stepson     Review of Systems   See History of Present Illness; otherwise all other systems are reviewed and negative  Allergies  Review of patient's allergies indicates no known allergies.  Home Medications   Prior to Admission medications   Medication Sig Start Date End Date Taking? Authorizing Provider  ibuprofen (ADVIL,MOTRIN) 200 MG tablet Take 400 mg by mouth every 6 (six) hours as needed for moderate pain.   Yes Historical Provider, MD  meloxicam (MOBIC) 7.5 MG tablet Take 2 tablets (15 mg total) by mouth daily. 06/17/13  Yes Noland Fordyce, PA-C   BP 107/76  Pulse 78  Temp(Src) 98 F (36.7 C) (Oral)  Resp 18  Ht 5\' 2"  (1.575 m)  Wt 160 lb (72.576 kg)  BMI 29.26 kg/m2  SpO2 94%  LMP 03/04/2006 Physical Exam  Nursing note and vitals reviewed. Constitutional: She is oriented to person, place, and time. She appears well-developed and well-nourished. No distress.  HENT:  Head: Normocephalic and  atraumatic.  Right Ear: External ear normal.  Left Ear: External ear normal.  Nose: Nose normal.  Mouth/Throat: Oropharynx is clear and moist. No oropharyngeal exudate (no exudate, but has posterior pharynx erythema).  Eyes: Conjunctivae and EOM are normal. Pupils are equal, round, and reactive to light.  Neck: Normal range of motion. Neck supple. No JVD present. No tracheal deviation present. No thyromegaly present.  Cardiovascular: Normal rate, regular rhythm, normal heart sounds and intact distal pulses.  Exam reveals no gallop and no friction rub.   No murmur heard. Pulmonary/Chest: Effort normal. No stridor. No respiratory distress. She has wheezes (end expiratory wheezing). She has no rales. She exhibits no tenderness.  Abdominal: Soft. Bowel sounds are normal. She exhibits no distension and no mass. There is no tenderness. There is no rebound and no guarding.  Musculoskeletal: Normal range of motion. She exhibits no edema and no tenderness.  Lymphadenopathy:    She has no cervical adenopathy.  Neurological: She is alert and oriented to person, place, and time. She exhibits normal muscle tone. Coordination normal.  Skin: Skin is warm and dry. No rash noted. No erythema. No pallor.  Psychiatric: She has a normal mood and affect. Her behavior is normal. Judgment and thought content normal.    ED Course  Procedures (including critical care time) Labs Review Labs Reviewed - No data to  display  Imaging Review Dg Chest 2 View  09/12/2013   CLINICAL DATA:  Shortness of breath, cough, congestion, and throat tightness for 3 days.  EXAM: CHEST  2 VIEW  COMPARISON:  07/01/2012  FINDINGS: The heart size and mediastinal contours are within normal limits. Both lungs are clear. The visualized skeletal structures are unremarkable.  IMPRESSION: No active cardiopulmonary disease.   Electronically Signed   By: Lucienne Capers M.D.   On: 09/12/2013 05:34     EKG Interpretation   Date/Time:  Sunday  September 12 2013 03:14:34 EDT Ventricular Rate:  75 PR Interval:  204 QRS Duration: 79 QT Interval:  399 QTC Calculation: 446 R Axis:   44 Text Interpretation:  Sinus rhythm Borderline prolonged PR interval No  significant change since last tracing Confirmed by Jodel Mayhall  MD, Javeion Cannedy ([id number])  on 09/12/2013 5:14:06 AM      MDM   Final diagnoses:  URI (upper respiratory infection)    53 year old female with 2 days of chest congestion, productive cough, wheezing.  Chest x-ray unremarkable.  EKG normal.  Patient with mild Congestion but no wheezing on exam.  Plan for supportive care suspect viral infection.   Kalman Drape, MD 09/12/13 [phone]

## 2013-09-12 NOTE — Discharge Instructions (Signed)
Take medications as prescribed.  Follow up with your doctor for recheck in 2-3 days.  Return to the ER for worsening condition or new concerning symptoms.   Nhi?m Trng ???ng H H?p Trn, Ng??i L?n (Upper Respiratory Infection, Adult) Nhi?m trng ???ng h h?p trn (URI) ?i khi cn ???c g?i l c?m l?nh thng th??ng. ???ng h h?p trn bao g?m m?i, xoang, h?ng, kh qu?n v ph? qu?n. Ph? qu?n l ???ng h h?p d?n ??n ph?i. H?u h?t m?i ng??i c?i thi?n trong vng 1 tu?n, nh?ng cc tri?u ch?ng c th? ko di ??n 2 tu?n. Ho cn l?i c th? ko di th?m ch lu h?n.  NGUYN NHN Nhi?u vi rt khc nhau c th? ly nhi?m cc m lt ???ng h h?p trn. Cc m ny tr? nn b? kch thch, vim v th??ng tr? nn r?t ?m ??t. S? s?n sinh ch?t nh?n c?ng l ph? bi?n. C?m l?nh d? ly. B?n c th? d? dng ly vi rt cho ng??i khc khi ti?p xc b?ng mi?ng. ?i?u ny bao g?m hn, dng chung ly, ho ho?c h?t h?i. Ch?m vo mi?ng ho?c m?i b?n v sau ? ch?m vo m?t b? m?t, sau ? ng??i khc ch?m vo b? m?t ny c?ng c th? b? ly vi rt. TRI?U CH?NG Cc tri?u ch?ng th??ng pht tri?n 1-3 ngy sau khi b?n ti?p xc v?i vi rt c?m l?nh. Tri?u ch?ng ? m?i ng??i m?t khc. Cc tri?u ch?ng c th? bao g?m:  Ch?y n??c m?i.  H?t h?i.  Ngh?t m?i.  Kch thch xoang.  ?au h?ng.  M?t ti?ng (vim thanh qu?n).  Ho.  M?t m?i.  ?au nh?c c?.  ?n khng ngon mi?ng.  ?au ??u.  S?t nh?. CH?N ?ON B?n c th? ch?n ?on b?nh c?m l?nh c?a chnh mnh d?a vo nh?ng tri?u ch?ng quen thu?c, v h?u h?t m?i ng??i b? c?m l?nh 2-3 l?n m?i n?m. Chuyn gia ch?m [place] s?c kh?e c?a b?n c th? xc nh?n ?i?u ny sau khi khm b?n. Quan tr?ng nh?t, chuyn gia ch?m [place] y t? c?a b?n c th? ki?m tra v ??m b?o r?ng cc tri?u ch?ng c?a b?n khng ph?i do m?t b?nh khc nh? vim h?ng, vim xoang, vim ph?i, hen suy?n ho?c vim ti?u thi?t. Khng c?n thi?t xt nghi?m mu, ki?m tra h?ng v ch?p [place] ?? ch?n ?on c?m l?nh thng th??ng, nh?ng ?i khi chng c th? h?u  ch trong vi?c lo?i tr? cc b?nh khc nghim tr?ng h?n. Chuyn gia ch?m [place] s?c kh?e c?a b?n s? quy?t ??nh c c?n thm cc xt nghi?m khc khng. R?I RO V BI?N CH?NG B?n c th? c nguy c? b? m?t tr??ng h?p nghim tr?ng h?n c?m l?nh thng th??ng n?u b?n ht thu?c l, c b?nh tim m?n tnh (nh? suy tim), ho?c b?nh ph?i (nh? hen suy?n), ho?c n?u b?n c h? th?ng mi?n d?ch suy y?u. Nh?ng ng??i r?t tr? v r?t gi c?ng c nguy c? nhi?m trng nghim tr?ng h?n. Vim xoang do vi khu?n, nhi?m trng tai gi?a, vim ph?i do vi khu?n c th? khi?n c?m l?nh thng th??ng ph?c t?p thm. C?m l?nh thng th??ng c th? lm tr?m tr?ng thm b?nh hen suy?n v t?c ngh?n ph?i m?n tnh (COPD). ?i khi, nh?ng bi?n ch?ng ny c th? yu c?u ch?m  y t? kh?n c?p v c th? ?e d?a tnh m?ng. PHNG NG?A Cch t?t nh?t ?? b?o v? trnh c?m l?nh l th?c hnh v? sinh t?t. Trnh ti?p xc  b?ng mi?ng ho?c b?ng tay v?i nh?ng ng??i c tri?u ch?ng c?m l?nh. R?a tay th??ng xuyn n?u ti?p xc x?y ra. Khng c b?ng ch?ng r rng r?ng vitamin C, vitamin E, Echinacea ho?c t?p th? d?c lm gi?m kh? n?ng pht tri?n c?m l?nh. Tuy nhin, b?n lun nn ngh? ng?i nhi?u v c ch? ?? dinh d??ng t?t. ?I?U TR? ?i?u tr? nh?m gi?m tri?u ch?ng. Khng c thu?c ch?a. Khng sinh khng hi?u qu?, v nhi?m trng do vi rt gy ra, ch? khng ph?i do vi khu?n. ?i?u tr? c th? bao g?m:  U?ng nhi?u n??c h?n. ?? u?ng th? thao cung c?p ch?t ?i?n gi?i, ???ng v cc ch?t l?ng c gi tr?[place]  Th? s??ng m n??c nng ho?c h?i n??c (bnh phun h?i ho?c vi sen).  ?n sp g ho?c n??c canh trong khc v duy tr ch? ?? dinh d??ng t?t.  Ngh? ng?i th?t nhi?u.  S? d?ng thu?c [place] mi?ng ho?c thu?c u?ng ?? t?o c?m gic tho?i mi.  Ki?m sot s?t b?ng ibuprofen ho?c acetaminophen theo ch? d?n c?a chuyn gia ch?m [place] s?c kh?e.  T?ng c??ng s? d?ng thu?c d?ng ht n?u b?n b? b?nh hen suy?n. S? d?ng gel k?m v k?o dn k?m trong 24 gi? ??u tin sau khi b? c?m l?nh thng th??ng c th? rt ng?n th?i  gian v lm gi?m b?t m?c ?? nghim tr?ng c?a cc tri?u ch?ng. Thu?c gi?m ?au c th? c tc d?ng v?i s?t, ?au nh?c c? b?p v ?au c? h?ng. Nhi?u lo?i thu?c khng c?n k ??n c s?n ?? ?i?u tr? t?c ngh?n v ch?y n??c m?i. Chuyn gia ch?m [place] s?c kh?e c?a b?n c th? ??a ra cc khuy?n co v c th? ?? ngh? s? d?ng thu?c x?t m?i ho?c ph?i cho cc tri?u ch?ng khc. H??NG D?N CH?M [place] T?I NH  Ch? s? d?ng thu?c khng c?n k toa ho?c thu?c c?n k toa ?? gi?m ?au, gi?m c?m gic kh ch?u ho?c h? s?t theo ch? d?n c?a chuyn gia ch?m [place] s?c kh?e c?a b?n.  S? d?ng my t?o ?? ?m s??ng m ?m ho?c ht h?i n??c t? m?t vi sen ?? t?ng ?? ?m khng kh. ?i?u ny c th? ti?t ?m v gip d? th? h?n.  U?ng ?? n??c v dung d?ch ?? n??c ti?u trong ho?c c mu vng nh?t.  Ngh? ng?i theo nhu c?u.  Tr? l?i lm vi?c khi nhi?t ?? c?a b?n ? tr? l?i bnh th??ng ho?c chuyn gia ch?m [place] y t? [place] b?n lm nh? v?y. B?n c th? c?n ? nh lu h?n ?? trnh ly nhi?m cho ng??i khc. B?n c?ng c th? s? d?ng m?t n? v r?a tay c?n th?n ?? ng?n ng?a ly [place] vi rt. HY ?I KHM N?U:  Sau vi ngy ??u tin, b?n c?m th?y t? h?n ch? khng ph?i l t?t h?n.  B?n c?n l?i khuyn c?a chuyn gia ch?m [place] s?c kh?e v? thu?c ?? ki?m sot cc tri?u ch?ng.  B?n b? ?n l?nh, kh th? tr?m tr?ng h?n ho?c ??m mu nu ho?c ??. ?y c th? l nh?ng d?u hi?u c?a vim ph?i.  B?n b? ch?y n??c m?i mu vng ho?c nu ho?c ?au ? m?t, ??c bi?t l khi b?n u?n cong v? pha tr??c. ?y c th? l nh?ng d?u hi?u c?a vim xoang.  B?n b? s?t, s?ng h?ch c?, ?au khi nu?t ho?c cc vng mu tr?ng ? m?t sau c?a c? h?ng. ?y c th? l nh?ng  d?u hi?u c?a vim h?ng. HY NGAY L?P T?C ?I KHM N?U:  B?n b? s?t.  B?n b? nh?c ??u d? d?i ho?c dai d?ng, ?au tai, ?au xoang ho?c ?au ng?c.  B?n b? th? kh kh, ho ko di, ho ra mu ho?c c s? thay ??i trong ch?t nh?n thng th??ng c?a b?n (n?u b?n b? b?nh ph?i m?n tnh).  B?n b? ?au c? ho?c c? c?ng. Document Released: 09/03/2010  Document Revised: 10/21/2012 [facility] Patient Information 2015 [place]. This information is not intended to replace advice given to you by your health care provider. Make sure you discuss any questions you have with your health care provider.

## 2014-01-01 ENCOUNTER — Emergency Department (HOSPITAL_COMMUNITY)
Admission: EM | Admit: 2014-01-01 | Discharge: 2014-01-01 | Disposition: A | Discharge status: Home / Self Care | Payer: Commercial Managed Care - PPO | Attending: Emergency Medicine | Admitting: Emergency Medicine

## 2014-01-01 ENCOUNTER — Encounter (HOSPITAL_COMMUNITY): Payer: Self-pay | Admitting: Emergency Medicine

## 2014-01-01 DIAGNOSIS — Z79899 Other long term (current) drug therapy: Secondary | ICD-10-CM | POA: Insufficient documentation

## 2014-01-01 DIAGNOSIS — K219 Gastro-esophageal reflux disease without esophagitis: Secondary | ICD-10-CM | POA: Insufficient documentation

## 2014-01-01 DIAGNOSIS — R1013 Epigastric pain: Secondary | ICD-10-CM | POA: Insufficient documentation

## 2014-01-01 DIAGNOSIS — Z862 Personal history of diseases of the blood and blood-forming organs and certain disorders involving the immune mechanism: Secondary | ICD-10-CM | POA: Insufficient documentation

## 2014-01-01 DIAGNOSIS — Z791 Long term (current) use of non-steroidal anti-inflammatories (NSAID): Secondary | ICD-10-CM | POA: Insufficient documentation

## 2014-01-01 LAB — CBC WITH DIFFERENTIAL/PLATELET
BASOS ABS: 0 10*3/uL (ref 0.0–0.1)
BASOS PCT: 0 % (ref 0–1)
EOS PCT: 5 % (ref 0–5)
Eosinophils Absolute: 0.4 10*3/uL (ref 0.0–0.7)
HCT: 36.2 % (ref 36.0–46.0)
Hemoglobin: 12.4 g/dL (ref 12.0–15.0)
Lymphocytes Relative: 39 % (ref 12–46)
Lymphs Abs: 2.9 10*3/uL (ref 0.7–4.0)
MCH: 28 pg (ref 26.0–34.0)
MCHC: 34.3 g/dL (ref 30.0–36.0)
MCV: 81.7 fL (ref 78.0–100.0)
Monocytes Absolute: 0.4 10*3/uL (ref 0.1–1.0)
Monocytes Relative: 5 % (ref 3–12)
NEUTROS ABS: 3.8 10*3/uL (ref 1.7–7.7)
Neutrophils Relative %: 51 % (ref 43–77)
Platelets: 275 10*3/uL (ref 150–400)
RBC: 4.43 MIL/uL (ref 3.87–5.11)
RDW: 13.8 % (ref 11.5–15.5)
WBC: 7.4 10*3/uL (ref 4.0–10.5)

## 2014-01-01 LAB — URINALYSIS, ROUTINE W REFLEX MICROSCOPIC
Bilirubin Urine: NEGATIVE
Glucose, UA: NEGATIVE mg/dL
Ketones, ur: NEGATIVE mg/dL
LEUKOCYTES UA: NEGATIVE
Nitrite: NEGATIVE
PH: 6.5 (ref 5.0–8.0)
PROTEIN: NEGATIVE mg/dL
SPECIFIC GRAVITY, URINE: 1.011 (ref 1.005–1.030)
Urobilinogen, UA: 0.2 mg/dL (ref 0.0–1.0)

## 2014-01-01 LAB — LIPASE, BLOOD: Lipase: 28 U/L (ref 11–59)

## 2014-01-01 LAB — COMPREHENSIVE METABOLIC PANEL
ALBUMIN: 3.5 g/dL (ref 3.5–5.2)
ALT: 19 U/L (ref 0–35)
AST: 19 U/L (ref 0–37)
Alkaline Phosphatase: 59 U/L (ref 39–117)
Anion gap: 12 (ref 5–15)
BUN: 11 mg/dL (ref 6–23)
CALCIUM: 9.2 mg/dL (ref 8.4–10.5)
CO2: 25 mEq/L (ref 19–32)
CREATININE: 0.61 mg/dL (ref 0.50–1.10)
Chloride: 102 mEq/L (ref 96–112)
GFR calc Af Amer: >90 mL/min (ref >90)
Glucose, Bld: 98 mg/dL (ref 70–99)
Potassium: 4 mEq/L (ref 3.7–5.3)
Sodium: 139 mEq/L (ref 137–147)
Total Bilirubin: 0.3 mg/dL (ref 0.3–1.2)
Total Protein: 7.7 g/dL (ref 6.0–8.3)

## 2014-01-01 LAB — URINE MICROSCOPIC-ADD ON

## 2014-01-01 MED ORDER — SUCRALFATE 1 G PO TABS
1.0000 g | ORAL_TABLET | Freq: Four times a day (QID) | ORAL | Status: DC
Start: 2014-01-01 — End: 2015-01-11

## 2014-01-01 MED ORDER — GI COCKTAIL ~~LOC~~
30.0000 mL | Freq: Once | ORAL | Status: AC
  Administered 2014-01-01: 30 mL via ORAL
  Filled 2014-01-01: qty 30

## 2014-01-01 MED ORDER — FAMOTIDINE 20 MG PO TABS
40.0000 mg | ORAL_TABLET | Freq: Once | ORAL | Status: AC
  Administered 2014-01-01: 40 mg via ORAL
  Filled 2014-01-01: qty 2

## 2014-01-01 MED ORDER — PANTOPRAZOLE SODIUM 20 MG PO TBEC: 20.0000 mg | DELAYED_RELEASE_TABLET | Freq: Every day | ORAL | Status: DC

## 2014-01-01 NOTE — ED Notes (Signed)
Called lab to check status of UA. Sts it will be ran in the next 92min.

## 2014-01-01 NOTE — Discharge Instructions (Signed)
?au b?ng (Abdominal Pain) C nhi?u nguyn nhn d?n ??n ?au b?ng. Thng th??ng ?au b?ng l do m?t b?nh gy ra v s? khng ?? n?u khng ?i?u tr?Marland Kitchen B?nh ny c th? ???c theo di v ?i?u tr? t?i nh. Chuyn gia ch?m [place] s?c kh?e s? ti?n hnh khm th?c th? v c th? yu c?u lm xt nghi?m mu v ch?p X quang ?? xc ??nh m?c ?? nghim tr?ng c?a c?n ?au b?ng. Tuy nhin, trong nhi?u tr??ng h?p, ph?i m?t nhi?u th?i gian h?n ?? xc ??nh r nguyn nhn gy ?au b?ng. Tr??c khi tm ra nguyn nhn, chuyn gia ch?m [place] s?c kh?e c th? khng bi?t li?u qu v? c c?n lm thm xt nghi?m ho?c ti?p t?c ?i?u tr? hay khng. H??NG D?N CH?M [place] T?I NH  Theo di c?n ?au b?ng xem c b?t k? thay ??i no khng. Nh?ng hnh ??ng sau c th? gip lo?i b? b?t c? c?m gic kh ch?u no qu v? ?ang b?.  Ch? s? d?ng thu?c khng c?n k ??n ho?c thu?c c?n k ??n theo ch? d?n c?a chuyn gia ch?m [place] s?c kh?e.  Khng dng thu?c nhu?n trng tr? khi ???c chuyn gia ch?m [place] s?c kh?e ch? ??nh.  Th? dng m?t ch? ?? ?n l?ng (n??c lu?c th?t, tr, ho?c n??c) theo ch? d?n c?a chuyn gia ch?m [place] s?c kh?e. Chuy?n d?n sang m?t ch? ?? ?n nh? n?u ch?u ???c. ?I KHM N?U:  Qu v? b? ?au b?ng khng r nguyn nhn.  Qu v? b? ?au b?ng km theo bu?n nn ho?c tiu ch?y.  Qu v? b? ?au khi ?i ti?u ho?c ?i ngoi.  Qu v? b? c?n ?au b?ng lm th?c gi?c vo ban ?m.  Qu v? b? ?au b?ng n?ng thm ho?c ?? h?n khi ?n.  Qu v? b? ?au b?ng n?ng thm khi ?n ?? ?n nhi?u ch?t bo.  Qu v? b? s?t. NGAY L?P T?C ?I KHM N?U:   C?n ?au khng kh?i trong vng 2 gi?Sander Nephew v? v?n ti?p t?c b? i (nn m?a).  Ch? c th? c?m th?y ?au ? m?t s? ph?n b?ng, ch?ng h?n nh? ? ph?n b?ng bn ph?i ho?c bn d??i tri.  Phn c?a qu v? c mu ho?c c mu ?en nh? h?c n. ??M B?O QU V?:  Hi?u r cc h??ng d?n ny.  S? theo di tnh tr?ng c?a mnh.  S? yu c?u tr? gip ngay l?p t?c n?u qu v? c?m th?y khng kh?e ho?c th?y tr?m tr?ng h?n. Document Released: 02/18/2005  Document Revised: 02/23/2013 [facility] Patient Information 2015 [place]. This information is not intended to replace advice given to you by your health care provider. Make sure you discuss any questions you have with your health care provider.  B?nh Tro Ng??c D? Dy Th?c Qu?n, Ng??i L?n (Gastroesophageal Reflux Diseaes, Adult) B?nh tro ng??c d? dy th?c qu?n (GERD) x?y ra khi axit t? d? dy tro ln th?c qu?n. Khi axit ti?p xc v?i th?c qu?n, axit gy ra ?au (vim) trong th?c qu?n. Theo th?i gian, GERD c th? t?o ra cc l? nh? (cc v?t lot) ? nim m?c th?c qu?n.  NGUYN NHN  Tr?ng l??ng c? th? t?ng. ?i?u ny t?o p l?c ln d? dy, lm t?ng axit t? d? dy vo th?c qu?n.  Ht thu?c l. Ht thu?c l lm t?ng s?n sinh axit trong d? dy.  U?ng r??u. ?y l nguyn nhn lm gi?m p l?c trong c? th?t th?c qu?n  d??i (van ho?c vng c? gi?a th?c qu?n v d? dy), cho php axit t? d? dy vo th?c qu?n.  ?n t?i mu?n v b?ng no. Tnh tr?ng ny lm t?ng p l?c c?ng nh? t?ng s?n sinh axit trong d? dy.  D? t?t c? th?t th?c qu?n d??i. ?i khi khng tm th?y nguyn nhn. TRI?U CH?NG  ?au rt ? ph?n d??i gi?a ng?c pha sau x??ng ?c v ? khu v?c gi?a d? dy. Hi?n t??ng ny c th? x?y ra hai l?n m?t tu?n ho?c th??ng xuyn h?n.  Kh nu?t.  ?au h?ng.  Ho khan.  Cc tri?u ch?ng gi?ng hen suy?n, bao g?m t?c ng?c,kh th? ho?c th? kh kh. CH?N ?ON Chuyn gia ch?m [place] s?c kh?e c th? ch?n ?on GERD d?a trn cc tri?u ch?ng c?a b?n. Trong m?t s? tr??ng h?p, ch?p X quang v cc xt nghi?m khc c th? ???c ti?n hnh ?? ki?m tra cc bi?n ch?ng ho?c tnh tr?ng c?a d? dy v th?c qu?n. ?I?U TR? Chuyn gia ch?m [place] s?c kh?e c th? khuy?n ngh? dng thu?c khng c?n k ??n ho?c thu?c c?n k ??n ?? gip gi?m s?n sinh axit. Hy h?i chuyn gia ch?m [place] s?c kh?e c?a b?n tr??c khi b?t ??u ho?c dng thm b?t k? lo?i thu?c m?i no. H??NG D?N CH?M [place] T?I NH  Thay ??i cc y?u t? m b?n c th? ki?m sot ???c. H?i  chuyn gia ch?m [place] s?c kh?e ?? ???c h??ng d?n v? vi?c gi?m cn, b? thu?c l v s? d?ng r??u.  Hessie Diener cc lo?i th?c ph?m v ?? u?ng lm cho cc tri?u ch?ng t?i t? h?n, ch?ng h?n nh?:  ?? u?ng c caffeine ho?c r??u.  S c la.  B?c h ho?c v? b?c h.  T?i v hnh ty.  Th?c ?n [place].  Tri cy h? cam, ch?ng h?n nh? cam, chanh hay chanh ty.  Cc th?c ?n c c chua, ch?ng h?n nh? n??c x?t, ?t, salsa (n??c x?t [place]) v bnh pizza.  Cc lo?i th?c ?n chin xo v nhi?u ch?t bo.  Trnh n?m xu?ng ng? 3 ti?ng tr??c gi? ?i ng? ho?c tr??c khi c m?t gi?c ng? ng?n.  ?n nh?ng b?a ?n nh?, th??ng xuyn h?n thay v cc b?a ?n l?n.  M?c qu?n o r?ng. Khng ?eo b?t c? th? g ch?t quanh th?t l?ng gy p l?c ln d? dy.  Nng ??u gi??ng cao ln t? 6 ??n 8 inch b?ng cc kh?i g? ?? gip b?n ng?. S? d?ng thm g?i s? khng c tc d?ng.  Ch? s? d?ng thu?c khng c?n k ??n ho?c thu?c c?n k ??n ?? gi?m ?au, gi?m c?m gic kh ch?u ho?c h? s?t theo ch? d?n c?a chuyn gia ch?m [place] s?c kh?e c?a b?n.  Khng dng thu?c atpirin, ibuprofen ho?c cc thu?c ch?ng vim khng c steroid (NSAID) khc. HY NGAY L?P T?C ?I KHM N?U:  B?n b? ?au ? cnh tay, c?, hm, r?ng ho?c l?ng.  Hi?n t??ng ?au t?ng ln ho?c thay ??i theo c??ng ?? ho?c th?i gian.  B?n b? bu?n nn, nn ho?c ?? m? hi (tot m? hi).  B?n b? kh th? ho?c ng?t x?u.  Ch?t nn c mu xanh l cy, vng, ?en ho?c trng gi?ng nh? b c ph ho?c mu.  Phn c mu ??, ?? nh? mu ho?c ?en. Nh?ng tri?u ch?ng ny c th? l d?u hi?u c?a cc v?n ?? khc, ch?ng h?n nh? b?nh tim, ch?y mu d? dy  ho?c ch?y mu th?c qu?n. ??M B?O B?N:  Hi?u cc h??ng d?n ny.  S? theo di tnh tr?ng c?a mnh.  S? yu c?u tr? gip ngay l?p t?c n?u b?n c?m th?y khng ?? ho?c tnh tr?ng tr?m tr?ng h?n. Document Released: 11/28/2004 Document Revised: 10/21/2012 [facility] Patient Information 2015 [place]. This information is not intended to replace advice given to you by  your health care provider. Make sure you discuss any questions you have with your health care provider.

## 2014-01-01 NOTE — ED Provider Notes (Signed)
CSN: [id number]     Arrival date & time 01/01/14  7829 History   First MD Initiated Contact with Patient 01/01/14 0710     Chief Complaint  Patient presents with  . Abdominal Pain     (Consider location/radiation/quality/duration/timing/severity/associated sxs/prior Treatment) Patient is a 53 y.o. female presenting with abdominal pain. The history is provided by the patient and the spouse.  Abdominal Pain Pain location:  Periumbilical and suprapubic Pain quality: burning   Pain severity:  Mild Onset quality:  Gradual Duration:  2 days Timing:  Intermittent Progression:  Worsening Chronicity:  New Context: eating   Relieved by:  Nothing Worsened by:  Nothing tried Ineffective treatments:  None tried Associated symptoms: nausea   Associated symptoms: no diarrhea, no fever, no hematuria, no vaginal bleeding, no vaginal discharge and no vomiting   Associated symptoms comment:  Notes constipation   Past Medical History  Diagnosis Date  . Anemia 10 years ago    no medication   History reviewed. No pertinent past surgical history. No family history on file. History  Substance Use Topics  . Smoking status: Never Smoker   . Smokeless tobacco: Not on file  . Alcohol Use: No   OB History   Grav Para Term Preterm Abortions TAB SAB Ect Mult Living   1 1 1       1      Obstetric Comments   1 stepson     Review of Systems  Constitutional: Negative for fever.  Gastrointestinal: Positive for nausea and abdominal pain. Negative for vomiting and diarrhea.  Genitourinary: Negative for hematuria, vaginal bleeding and vaginal discharge.  All other systems reviewed and are negative.     Allergies  Review of patient's allergies indicates no known allergies.  Home Medications   Prior to Admission medications   Medication Sig Start Date End Date Taking? Authorizing Provider  chlorpheniramine-HYDROcodone (TUSSIONEX) 10-8 MG/5ML LQCR Take 5 mLs by mouth every 12 (twelve) hours  as needed for cough. 09/12/13   Kalman Drape, MD  guaiFENesin (MUCINEX) 600 MG 12 hr tablet Take 2 tablets (1,200 mg total) by mouth 2 (two) times daily. 09/12/13   Kalman Drape, MD  ibuprofen (ADVIL,MOTRIN) 200 MG tablet Take 400 mg by mouth every 6 (six) hours as needed for moderate pain.    Historical Provider, MD  meloxicam (MOBIC) 7.5 MG tablet Take 2 tablets (15 mg total) by mouth daily. 06/17/13   Noland Fordyce, PA-C   BP 122/69  Pulse 74  Temp(Src) 98 F (36.7 C) (Oral)  Resp 20  SpO2 97%  LMP 03/04/2006 Physical Exam  Nursing note and vitals reviewed. Constitutional: She is oriented to person, place, and time. She appears well-developed and well-nourished.  Non-toxic appearance. No distress.  HENT:  Head: Normocephalic and atraumatic.  Eyes: Conjunctivae, EOM and lids are normal. Pupils are equal, round, and reactive to light.  Neck: Normal range of motion. Neck supple. No tracheal deviation present. No mass present.  Cardiovascular: Normal rate, regular rhythm and normal heart sounds.  Exam reveals no gallop.   No murmur heard. Pulmonary/Chest: Effort normal and breath sounds normal. No stridor. No respiratory distress. She has no decreased breath sounds. She has no wheezes. She has no rhonchi. She has no rales.  Abdominal: Soft. Normal appearance and bowel sounds are normal. She exhibits no distension. There is tenderness in the periumbilical area and suprapubic area. There is no rigidity, no rebound, no guarding and no CVA tenderness.    Musculoskeletal:  Normal range of motion. She exhibits no edema and no tenderness.  Neurological: She is alert and oriented to person, place, and time. She has normal strength. No cranial nerve deficit or sensory deficit. GCS eye subscore is 4. GCS verbal subscore is 5. GCS motor subscore is 6.  Skin: Skin is warm and dry. No abrasion and no rash noted.  Psychiatric: She has a normal mood and affect. Her speech is normal and behavior is normal.     ED Course  Procedures (including critical care time) Labs Review Labs Reviewed  CBC WITH DIFFERENTIAL  COMPREHENSIVE METABOLIC PANEL  URINALYSIS, ROUTINE W REFLEX MICROSCOPIC  LIPASE, BLOOD    Imaging Review No results found.   EKG Interpretation None      MDM   Final diagnoses:  None    Pt given GI cocktail and Pepcid and feels better. Suspect GERD as a cause of her symptoms. Labs are reassuring and she is stable for discharge    Leota Jacobsen, MD 01/01/14 [phone]

## 2014-01-01 NOTE — ED Notes (Addendum)
Per pt, states abdominal pain for about a week-constipation, had 2 normal BMs this am and she felt better afterwards-states 2 days of congestion-states pain increases after she eats a meal

## 2014-01-03 ENCOUNTER — Encounter (HOSPITAL_COMMUNITY): Payer: Self-pay | Admitting: Emergency Medicine

## 2014-03-20 ENCOUNTER — Emergency Department (HOSPITAL_COMMUNITY): Payer: Self-pay

## 2014-03-20 ENCOUNTER — Emergency Department (HOSPITAL_COMMUNITY): Payer: Commercial Managed Care - PPO

## 2014-03-20 ENCOUNTER — Encounter (HOSPITAL_COMMUNITY): Payer: Self-pay | Admitting: *Deleted

## 2014-03-20 ENCOUNTER — Emergency Department (HOSPITAL_COMMUNITY)
Admission: EM | Admit: 2014-03-20 | Discharge: 2014-03-20 | Disposition: A | Discharge status: Home / Self Care | Payer: Self-pay | Attending: Emergency Medicine | Admitting: Emergency Medicine

## 2014-03-20 DIAGNOSIS — R0602 Shortness of breath: Secondary | ICD-10-CM | POA: Insufficient documentation

## 2014-03-20 DIAGNOSIS — R0789 Other chest pain: Secondary | ICD-10-CM | POA: Insufficient documentation

## 2014-03-20 DIAGNOSIS — Z79899 Other long term (current) drug therapy: Secondary | ICD-10-CM | POA: Insufficient documentation

## 2014-03-20 DIAGNOSIS — R079 Chest pain, unspecified: Secondary | ICD-10-CM

## 2014-03-20 DIAGNOSIS — Z862 Personal history of diseases of the blood and blood-forming organs and certain disorders involving the immune mechanism: Secondary | ICD-10-CM | POA: Insufficient documentation

## 2014-03-20 LAB — BASIC METABOLIC PANEL
Anion gap: 10 (ref 5–15)
BUN: 10 mg/dL (ref 6–23)
CHLORIDE: 105 meq/L (ref 96–112)
CO2: 23 mmol/L (ref 19–32)
Calcium: 8.9 mg/dL (ref 8.4–10.5)
Creatinine, Ser: 0.63 mg/dL (ref 0.50–1.10)
GFR calc Af Amer: >90 mL/min (ref >90)
GFR calc non Af Amer: >90 mL/min (ref >90)
Glucose, Bld: 116 mg/dL — ABNORMAL HIGH (ref 70–99)
Potassium: 3.5 mmol/L (ref 3.5–5.1)
Sodium: 138 mmol/L (ref 135–145)

## 2014-03-20 LAB — CBC
HEMATOCRIT: 39.1 % (ref 36.0–46.0)
Hemoglobin: 13.4 g/dL (ref 12.0–15.0)
MCH: 28.2 pg (ref 26.0–34.0)
MCHC: 34.3 g/dL (ref 30.0–36.0)
MCV: 82.1 fL (ref 78.0–100.0)
Platelets: 285 10*3/uL (ref 150–400)
RBC: 4.76 MIL/uL (ref 3.87–5.11)
RDW: 14.2 % (ref 11.5–15.5)
WBC: 8.9 10*3/uL (ref 4.0–10.5)

## 2014-03-20 LAB — D-DIMER, QUANTITATIVE (NOT AT ARMC): D DIMER QUANT: 1.31 ug{FEU}/mL — AB (ref 0.00–0.48)

## 2014-03-20 LAB — I-STAT TROPONIN, ED
TROPONIN I, POC: 0 ng/mL (ref 0.00–0.08)
Troponin i, poc: 0.01 ng/mL (ref 0.00–0.08)

## 2014-03-20 MED ORDER — SODIUM CHLORIDE 0.9 % IV BOLUS (SEPSIS)
1000.0000 mL | Freq: Once | INTRAVENOUS | Status: AC
  Administered 2014-03-20: 1000 mL via INTRAVENOUS

## 2014-03-20 MED ORDER — FENTANYL CITRATE 0.05 MG/ML IJ SOLN
50.0000 ug | Freq: Once | INTRAMUSCULAR | Status: DC
  Filled 2014-03-20: qty 2

## 2014-03-20 MED ORDER — ASPIRIN 81 MG PO CHEW
324.0000 mg | CHEWABLE_TABLET | Freq: Once | ORAL | Status: AC
  Administered 2014-03-20: 324 mg via ORAL
  Filled 2014-03-20: qty 4

## 2014-03-20 MED ORDER — IOHEXOL 350 MG/ML SOLN
100.0000 mL | Freq: Once | INTRAVENOUS | Status: AC | PRN
  Administered 2014-03-20: 100 mL via INTRAVENOUS

## 2014-03-20 MED ORDER — NITROGLYCERIN 0.4 MG SL SUBL
0.4000 mg | SUBLINGUAL_TABLET | SUBLINGUAL | Status: DC | PRN
Start: 2014-03-20 — End: 2014-03-20
  Administered 2014-03-20 (×2): 0.4 mg via SUBLINGUAL
  Filled 2014-03-20 (×2): qty 1

## 2014-03-20 NOTE — ED Provider Notes (Signed)
TIME SEEN: 10:30 AM  CHIEF COMPLAINT: Chest pain  HPI: Patient is a 54 y.o. F with no significant past medical history who presents to the emergency department with a month of intermittent episodes of chest pressure that is worse with stress. States today she developed shortness of breath and pain that radiated into her jaw and down her arm. Pain is not pleuritic and is not exertional per patient. No associate nausea, vomiting, diaphoresis or dizziness. Patient's husband reports symptoms get worse when they talk about stressful situations like bills. Denies any fevers or cough. No lower [place] he swelling or pain. No prior history of PE or DVT. No prior history of stress test or cardiac catheterization. No history of hypertension, diabetes, hyperlipidemia, tobacco use, family history of CAD.  ROS: See HPI Constitutional: no fever  Eyes: no drainage  ENT: no runny nose   Cardiovascular:   chest pain  Resp:  SOB  GI: no vomiting GU: no dysuria Integumentary: no rash  Allergy: no hives  Musculoskeletal: no leg swelling  Neurological: no slurred speech ROS otherwise negative  PAST MEDICAL HISTORY/PAST SURGICAL HISTORY:  Past Medical History  Diagnosis Date  . Anemia 10 years ago    no medication    MEDICATIONS:  Prior to Admission medications   Medication Sig Start Date End Date Taking? Authorizing Provider  ibuprofen (ADVIL,MOTRIN) 200 MG tablet Take 400 mg by mouth every 6 (six) hours as needed for moderate pain.    Historical Provider, MD  pantoprazole (PROTONIX) 20 MG tablet Take 1 tablet (20 mg total) by mouth daily. 01/01/14   Leota Jacobsen, MD  polyethylene glycol [facility] Floria Raveling) packet Take 17 g by mouth daily.    Historical Provider, MD  sucralfate (CARAFATE) 1 G tablet Take 1 tablet (1 g total) by mouth 4 (four) times daily. 01/01/14   Leota Jacobsen, MD    ALLERGIES:  No Known Allergies  SOCIAL HISTORY:  History  Substance Use Topics  . Smoking status: Never  Smoker   . Smokeless tobacco: Not on file  . Alcohol Use: No    FAMILY HISTORY: No family history on file.  EXAM: LMP 03/04/2006 CONSTITUTIONAL: Alert and oriented and responds appropriately to questions. Well-appearing; well-nourished HEAD: Normocephalic EYES: Conjunctivae clear, PERRL ENT: normal nose; no rhinorrhea; moist mucous membranes; pharynx without lesions noted NECK: Supple, no meningismus, no LAD  CARD: RRR; S1 and S2 appreciated; no murmurs, no clicks, no rubs, no gallops RESP: Normal chest excursion without splinting or tachypnea; breath sounds clear and equal bilaterally; no wheezes, no rhonchi, no rales, left anterior chest walls [place] tender to palpation without crepitus or ecchymosis or deformity, no lesions on the chest wall ABD/GI: Normal bowel sounds; non-distended; soft, non-tender, no rebound, no guarding BACK:  The back appears normal and is non-tender to palpation, there is no CVA tenderness EXT: Normal ROM in all joints; non-tender to palpation; no edema; normal capillary refill; no cyanosis; no calf tenderness or swelling   SKIN: Normal color for age and race; warm NEURO: Moves all extremities equally PSYCH: The patient's mood and manner are appropriate. Grooming and personal hygiene are appropriate.  MEDICAL DECISION MAKING: Patient here with chest pain. She has no risk factors for ACS or pulmonary embolus but does report pain that is sharp in nature but also pressure with associative shortness of breath. We'll obtain cardiac labs, d-dimer, chest x-ray. We'll give aspirin and nitroglycerin and reassess. EKG shows no ischemic changes and is unchanged since July  2015.  ED PROGRESS: Patient's labs show mildly elevated d-dimer. Will obtain CT imaging to rule out pulmonary embolus. Chest x-ray clear. Troponin negative.   Patient reports some improvement in pain with aspirin and nitroglycerin. Her blood pressure however has dropped. We'll give fentanyl. Given she  is low risk for ACS at this time I feel we can do a second troponin and discharge her with cardiology follow-up. Patient and husband agree with this plan.    Patient is chest pain-free after fentanyl. Second troponin negative. Will discharge home with outpatient follow-up. Discussed return precautions. They verbalize understanding and are comparable with plan.    EKG Interpretation  Date/Time:  Sunday March 20 2014 10:09:55 EST Ventricular Rate:  88 PR Interval:  161 QRS Duration: 76 QT Interval:  360 QTC Calculation: 435 R Axis:   75 Text Interpretation:  Sinus rhythm Atrial premature complex Low voltage, precordial leads No significant change since last tracing July 2015  Confirmed by [place],  DO, KRISTEN [phone]) on 03/20/2014 10:16:01 AM         [place], DO 03/20/14 1500

## 2014-03-20 NOTE — ED Notes (Signed)
Pt reports intermittent stabbing L sided chest pain radiating to L shoulder and down L arm with sob. Sts she woke up this am with the pain worse than it has been and with sob. Pain and sob worse with exertion. Denies n/v, diaphoresis, dizziness.

## 2014-03-20 NOTE — Discharge Instructions (Signed)

## 2014-04-11 ENCOUNTER — Ambulatory Visit: Payer: Commercial Managed Care - PPO | Admitting: Certified Nurse Midwife

## 2014-04-11 ENCOUNTER — Encounter: Payer: Self-pay | Admitting: Certified Nurse Midwife

## 2014-05-06 ENCOUNTER — Ambulatory Visit: Payer: Commercial Managed Care - PPO | Admitting: Family Medicine

## 2015-01-03 HISTORY — PX: NO PAST SURGERIES: SHX2092

## 2015-01-11 ENCOUNTER — Encounter: Payer: Self-pay | Admitting: Medical

## 2015-01-11 ENCOUNTER — Ambulatory Visit (INDEPENDENT_AMBULATORY_CARE_PROVIDER_SITE_OTHER): Payer: BLUE CROSS/BLUE SHIELD | Admitting: Medical

## 2015-01-11 VITALS — BP 110/84 | HR 80 | Resp 14 | Ht 62.0 in | Wt 158.2 lb

## 2015-01-11 DIAGNOSIS — R1084 Generalized abdominal pain: Secondary | ICD-10-CM | POA: Diagnosis not present

## 2015-01-11 DIAGNOSIS — K5909 Other constipation: Secondary | ICD-10-CM | POA: Diagnosis not present

## 2015-01-11 DIAGNOSIS — Z1211 Encounter for screening for malignant neoplasm of colon: Secondary | ICD-10-CM | POA: Diagnosis not present

## 2015-01-11 DIAGNOSIS — R7301 Impaired fasting glucose: Secondary | ICD-10-CM | POA: Insufficient documentation

## 2015-01-11 DIAGNOSIS — K5900 Constipation, unspecified: Secondary | ICD-10-CM | POA: Insufficient documentation

## 2015-01-11 DIAGNOSIS — Z0000 Encounter for general adult medical examination without abnormal findings: Secondary | ICD-10-CM | POA: Insufficient documentation

## 2015-01-11 LAB — POCT URINALYSIS DIPSTICK
BILIRUBIN UA: NEGATIVE
Blood, UA: NEGATIVE
Glucose, UA: NEGATIVE
KETONES UA: NEGATIVE
Nitrite, UA: NEGATIVE
Protein, UA: NEGATIVE
Spec Grav, UA: 1.02
Urobilinogen, UA: NEGATIVE
pH, UA: 7

## 2015-01-11 LAB — HEMOGLOBIN A1C
HEMOGLOBIN A1C: 5.8 % — AB (ref <5.7)
MEAN PLASMA GLUCOSE: 120 mg/dL — AB (ref <117)

## 2015-01-11 LAB — CBC
HEMATOCRIT: 40.1 % (ref 36.0–46.0)
HEMOGLOBIN: 13.4 g/dL (ref 12.0–15.0)
MCH: 27.3 pg (ref 26.0–34.0)
MCHC: 33.4 g/dL (ref 30.0–36.0)
MCV: 81.8 fL (ref 78.0–100.0)
MPV: 9.2 fL (ref 8.6–12.4)
Platelets: 309 10*3/uL (ref 150–400)
RBC: 4.9 MIL/uL (ref 3.87–5.11)
RDW: 15.1 % (ref 11.5–15.5)
WBC: 8.8 10*3/uL (ref 4.0–10.5)

## 2015-01-11 LAB — TSH: TSH: 1.55 u[IU]/mL (ref 0.350–4.500)

## 2015-01-11 MED ORDER — LUBIPROSTONE 8 MCG PO CAPS: 8.0000 ug | ORAL_CAPSULE | Freq: Two times a day (BID) | ORAL | Status: DC

## 2015-01-11 NOTE — Progress Notes (Signed)
Subjective:   HPI  Michelle Barr is a 54 y.o. female who presents for a complete physical.  She is accompanied by her husband Michelle Barr who helps interpret. She speaks limited [place].  She is originally from [place].  Based on our conversation she has not had routine preventative care, and in her culture this isn't a routine thing that is done.  She typically only seeks medical care when she is in pain or has a problem.  She reports no history of colonoscopy.  She had pap smear last year with gyn.  Here only issue is ongoing abdominal pain.  Gets "abdominal pain" few times every month lasting for 2- 3 days.  No NVD.  Has BM every 2-3 days.  Denies vaginal or urinary c/o, but does have constipation.   No blood in stool.  Has been seen in the ED several times in the past for chest and abdominal pain.  At one point was advised echo, but she never had this done.  Prior labs, EKGs and prior eval reviewed from 2014 and 2012.  Reviewed their medical, surgical, family, social, medication, and allergy history and updated chart as appropriate.  Past Medical History  Diagnosis Date  . Anemia 10 years ago    no medication  . Impaired fasting blood sugar   . Chronic abdominal pain   . Encounter for routine gynecological examination     sees gynecology    Past Surgical History  Procedure Laterality Date  . No past surgeries  01/2015    Social History   Social History  . Marital Status: Married    Spouse Name: N/A  . Number of Children: N/A  . Years of Education: N/A   Occupational History  . house keeper [employer]   Social History Main Topics  . Smoking status: Never Smoker   . Smokeless tobacco: Not on file  . Alcohol Use: No  . Drug Use: No  . Sexual Activity:    Partners: Male    Birth Control/ Protection: Post-menopausal   Other Topics Concern  . 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 No pertinent family history.   Current outpatient prescriptions:  .  lubiprostone (AMITIZA) 8 MCG capsule, Take 1 capsule (8 mcg total) by mouth 2 (two) times daily with a meal., Disp: 60 capsule, Rfl: 2  No Known Allergies    Review of Systems Constitutional: -fever, -chills, -sweats, -unexpected weight change, -decreased appetite, -fatigue Allergy: -sneezing, -itching, -congestion Dermatology: -changing moles, --rash, -lumps ENT: -runny nose, -ear pain, -sore throat, -hoarseness, -sinus pain, -teeth pain, - ringing in ears, -hearing loss, -nosebleeds Cardiology: -chest pain, -palpitations, -swelling, -difficulty breathing when lying flat, -waking up short of breath Respiratory: -cough, -shortness of breath, -difficulty breathing with exercise or exertion, -wheezing, -coughing up blood Gastroenterology: +abdominal pain, -nausea, -vomiting, -diarrhea, -constipation, -blood in stool, -changes in bowel movement, -difficulty swallowing or eating Hematology: -bleeding, -bruising  Musculoskeletal: -joint aches, -muscle aches, -joint swelling, -back pain, -neck pain, -cramping, -changes in gait Ophthalmology: denies vision changes, eye redness, itching, discharge Urology: -burning with urination, -difficulty urinating, -blood in urine, -urinary frequency, -urgency, -incontinence Neurology: -headache, -weakness, -tingling, -numbness, -memory loss, -falls, -dizziness Psychology: -depressed mood, -agitation, -sleep problems     Objective:   Physical Exam  BP 110/84 mmHg  Pulse 80  Resp 14  Ht 5\' 2"  (1.575 m)  Wt 158 lb 3.2 oz (  71.759 kg)  BMI 28.93 kg/m2  LMP 03/04/2006  General appearance: alert, no distress, WD/WN, [place] female Skin: left upper chest with small flat brown 56mm diameter lesion with linear markings, otherwise scattered benign macules, no other worrisome lesions HEENT: normocephalic, conjunctiva/corneas normal, sclerae anicteric, PERRLA, EOMi, nares with  turbinate edema, slight dry blood discharge in the right nare, mucoid discharge bilat, pharynx normal Oral cavity: MMM, tongue normal, dentures present Neck: supple, no lymphadenopathy, no thyromegaly, no masses, normal ROM, no bruits Chest: non tender, normal shape and expansion Heart: RRR, normal S1, S2, no murmurs Lungs: CTA bilaterally, no wheezes, rhonchi, or rales Abdomen: +bs, soft, mild suprapubic tenderness, otherwise non tender, non distended, no masses, no hepatomegaly, no splenomegaly, no bruits Back: non tender, normal ROM, no scoliosis Musculoskeletal: upper extremities non tender, no obvious deformity, normal ROM throughout, lower extremities non tender, no obvious deformity, normal ROM throughout Extremities: no edema, no cyanosis, no clubbing Pulses: 2+ symmetric, upper and lower extremities, normal cap refill Neurological: alert, oriented x 3, CN2-12 intact, strength normal upper extremities and lower extremities, sensation normal throughout, DTRs 2+ throughout, no cerebellar signs, gait normal Psychiatric: normal affect, behavior normal, pleasant  Breast/gyn/rectal - deferred to gynecology   Assessment and Plan :    Encounter Diagnoses  Name Primary?  . Annual physical exam Yes  . Generalized abdominal pain   . Impaired fasting glucose   . Special screening for malignant neoplasms, colon   . Other constipation     Physical exam - discussed healthy lifestyle, diet, exercise, preventative care, vaccinations, and addressed their concerns.  Handout given. Routine labs today. Abdominal pain-pending trial of Amitiz, referral to GI.  F/u pending labs  Constipation - begin trial of Amitiza Referral to GI for chronic abdominal pain and screen for colonoscopy. See your eye doctor yearly for routine vision care. See your gynecologist yearly for routine gynecological care. See your dentist yearly for routine dental care including hygiene visits twice yearly.  She has  dentures. Follow-up pending labs, referral

## 2015-01-12 ENCOUNTER — Other Ambulatory Visit: Payer: Self-pay | Admitting: Medical

## 2015-01-12 LAB — COMPREHENSIVE METABOLIC PANEL
ALK PHOS: 71 U/L (ref 33–130)
ALT: 17 U/L (ref 6–29)
AST: 21 U/L (ref 10–35)
Albumin: 4.2 g/dL (ref 3.6–5.1)
BILIRUBIN TOTAL: 0.7 mg/dL (ref 0.2–1.2)
BUN: 9 mg/dL (ref 7–25)
CALCIUM: 9.4 mg/dL (ref 8.6–10.4)
CO2: 25 mmol/L (ref 20–31)
Chloride: 104 mmol/L (ref 98–110)
Creat: 0.67 mg/dL (ref 0.50–1.05)
Glucose, Bld: 91 mg/dL (ref 65–99)
POTASSIUM: 4.2 mmol/L (ref 3.5–5.3)
Sodium: 140 mmol/L (ref 135–146)
Total Protein: 7.9 g/dL (ref 6.1–8.1)

## 2015-01-12 LAB — LIPID PANEL
Cholesterol: 205 mg/dL — ABNORMAL HIGH (ref 125–200)
HDL: 40 mg/dL — AB (ref >=46)
LDL Cholesterol: 133 mg/dL — ABNORMAL HIGH (ref <130)
Total CHOL/HDL Ratio: 5.1 Ratio — ABNORMAL HIGH (ref <=5.0)
Triglycerides: 160 mg/dL — ABNORMAL HIGH (ref <150)
VLDL: 32 mg/dL — ABNORMAL HIGH (ref <30)

## 2015-01-12 MED ORDER — ATORVASTATIN CALCIUM 20 MG PO TABS: 20.0000 mg | ORAL_TABLET | Freq: Every day | ORAL | Status: DC

## 2015-01-20 ENCOUNTER — Encounter: Payer: Self-pay | Admitting: Internal Medicine

## 2015-01-26 ENCOUNTER — Encounter (HOSPITAL_COMMUNITY): Payer: Self-pay | Admitting: Emergency Medicine

## 2015-01-26 ENCOUNTER — Emergency Department (HOSPITAL_COMMUNITY): Payer: BLUE CROSS/BLUE SHIELD

## 2015-01-26 ENCOUNTER — Emergency Department (HOSPITAL_COMMUNITY)
Admission: EM | Admit: 2015-01-26 | Discharge: 2015-01-26 | Disposition: A | Discharge status: Home / Self Care | Payer: BLUE CROSS/BLUE SHIELD | Attending: Emergency Medicine | Admitting: Emergency Medicine

## 2015-01-26 DIAGNOSIS — G8929 Other chronic pain: Secondary | ICD-10-CM | POA: Diagnosis not present

## 2015-01-26 DIAGNOSIS — Z862 Personal history of diseases of the blood and blood-forming organs and certain disorders involving the immune mechanism: Secondary | ICD-10-CM | POA: Diagnosis not present

## 2015-01-26 DIAGNOSIS — Z79899 Other long term (current) drug therapy: Secondary | ICD-10-CM | POA: Insufficient documentation

## 2015-01-26 DIAGNOSIS — M25562 Pain in left knee: Secondary | ICD-10-CM | POA: Diagnosis not present

## 2015-01-26 MED ORDER — IBUPROFEN 200 MG PO TABS
600.0000 mg | ORAL_TABLET | Freq: Once | ORAL | Status: AC
Start: 2015-01-26 — End: 2015-01-26
  Administered 2015-01-26: 600 mg via ORAL
  Filled 2015-01-26: qty 3

## 2015-01-26 NOTE — ED Notes (Signed)
Pt c/o left knee intermittent pain onset 2 months ago, worsened last few days. Denies trauma, has been seen for the same but states she never got a referral and needs one.

## 2015-01-26 NOTE — ED Notes (Signed)
Patient transported to X-ray 

## 2015-01-26 NOTE — ED Provider Notes (Signed)
CSN: [id number]     Arrival date & time 01/26/15  1545 History   First MD Initiated Contact with Patient 01/26/15 1606     Chief Complaint  Patient presents with  . Knee Pain   HPI   54 year old female presents today with left knee pain. Patient reports that for the last 6 months she's been experiencing worsening left knee pain. She reports the pain is sharp, made worse with ambulation with associated "clicking" when she walks. She reports at rest she has very minimal pain. She reports the pain is worse in the posterior knee and right anterior joint line. She denies any trauma to the knee, swelling, redness, warmth to touch, loss of distal sensation strength or motor function. She denies any aggravating factors, history of septic or gouty arthritis. Patient does not like taking over-the-counter medications, has been using icy hot sparingly, and one attempted ibuprofen with minimal relief of symptoms. Patient saw her primary care recently for a general physical, was intended to have a orthopedic follow-up but did not get the referral information. Patient denies fever, chills, nausea, vomiting, fatigue, or any other concerning signs or symptoms.  Past Medical History  Diagnosis Date  . Anemia 10 years ago    no medication  . Impaired fasting blood sugar   . Chronic abdominal pain   . Encounter for routine gynecological examination     sees gynecology   Past Surgical History  Procedure Laterality Date  . No past surgeries  01/2015   History reviewed. No pertinent family history. Social History  Substance Use Topics  . Smoking status: Never Smoker   . Smokeless tobacco: None  . Alcohol Use: No   OB History    Gravida Para Term Preterm AB TAB SAB Ectopic Multiple Living   1 1 1       1       Obstetric Comments   1 stepson     Review of Systems  All other systems reviewed and are negative.  Allergies  Review of patient's allergies indicates no known allergies.  Home Medications    Prior to Admission medications   Medication Sig Start Date End Date Taking? Authorizing Provider  atorvastatin (LIPITOR) 20 MG tablet Take 1 tablet (20 mg total) by mouth daily. 01/12/15   Camelia Eng Tysinger, PA-C  lubiprostone (AMITIZA) 8 MCG capsule Take 1 capsule (8 mcg total) by mouth 2 (two) times daily with a meal. 01/11/15   Camelia Eng Tysinger, PA-C   BP 129/85 mmHg  Pulse 85  Temp(Src) 98.1 F (36.7 C) (Temporal)  Resp 16  SpO2 99%  LMP 03/04/2006   Physical Exam  Constitutional: She is oriented to person, place, and time. She appears well-developed and well-nourished.  HENT:  Head: Normocephalic and atraumatic.  Eyes: Conjunctivae are normal. Pupils are equal, round, and reactive to light. Right eye exhibits no discharge. Left eye exhibits no discharge. No scleral icterus.  Neck: Normal range of motion. No JVD present. No tracheal deviation present.  Pulmonary/Chest: Effort normal. No stridor.  Musculoskeletal:  External exam shows knees are equal bilateral no signs of swelling, redness, obvious deformity. Patient has tenderness to palpation of the right anterior joint line, posterior knee. She has pain with anterior posterior translation of the tibia, valgus varus loads. Difficult exam due to patient's pain. Distal sensation strength and motor function intact. Greater than 90 of flexion  Neurological: She is alert and oriented to person, place, and time. Coordination normal.  Skin: Skin is warm  and dry.  Psychiatric: She has a normal mood and affect. Her behavior is normal. Judgment and thought content normal.  Nursing note and vitals reviewed.   ED Course  Procedures (including critical care time) Labs Review Labs Reviewed - No data to display  Imaging Review Dg Knee 2 Views Left  01/26/2015  CLINICAL DATA:  Intermittent knee pain for 2 months EXAM: LEFT KNEE - 1-2 VIEW COMPARISON:  None. FINDINGS: No acute fracture.  No dislocation.  Minimal degenerative change.  IMPRESSION: No acute bony pathology. Electronically Signed   By: Marybelle Killings M.D.   On: 01/26/2015 17:38   I have personally reviewed and evaluated these images and lab results as part of my medical decision-making.   EKG Interpretation None      MDM   Final diagnoses:  Left knee pain    Labs:  Imaging: DG knee complete  Consults:  Therapeutics:  Discharge Meds:   Assessment/Plan: Patient presents with chronic left knee pain, no signs of infection including septic arthritis, gouty arthritis, or signs of trauma. No significant findings on plain films. Patient could potentially have the intra-articular soft tissue injury, she will be given crutches, Ace wrap, instructed to rice, follow-up with orthopedic specialist for further evaluation and management. She verbalizes understanding and agreement for today's plan and had no further questions concerns at time of discharge.         Okey Regal, PA-C 01/27/15 [id number]  Blanchie Dessert, MD 01/28/15 Dyann Kief

## 2015-01-26 NOTE — ED Notes (Signed)
Ortho notified of new orders.

## 2015-01-26 NOTE — Discharge Instructions (Signed)
Joint Pain Joint pain, which is also called arthralgia, can be caused by many things. Joint pain often goes away when you follow your health care provider's instructions for relieving pain at home. However, joint pain can also be caused by conditions that require further treatment. Common causes of joint pain include:  Bruising in the area of the joint.  Overuse of the joint.  Wear and tear on the joints that occur with aging (osteoarthritis).  Various other forms of arthritis.  A buildup of a crystal form of uric acid in the joint (gout).  Infections of the joint (septic arthritis) or of the bone (osteomyelitis). Your health care provider may recommend medicine to help with the pain. If your joint pain continues, additional tests may be needed to diagnose your condition. HOME CARE INSTRUCTIONS Watch your condition for any changes. Follow these instructions as directed to lessen the pain that you are feeling.  Take medicines only as directed by your health care provider.  Rest the affected area for as long as your health care provider says that you should. If directed to do so, raise the painful joint above the level of your heart while you are sitting or lying down.  Do not do things that cause or worsen pain.  If directed, apply ice to the painful area:  Put ice in a plastic bag.  Place a towel between your skin and the bag.  Leave the ice on for 20 minutes, 2-3 times per day.  Wear an elastic bandage, splint, or sling as directed by your health care provider. Loosen the elastic bandage or splint if your fingers or toes become numb and tingle, or if they turn cold and blue.  Begin exercising or stretching the affected area as directed by your health care provider. Ask your health care provider what types of exercise are safe for you.  Keep all follow-up visits as directed by your health care provider. This is important. SEEK MEDICAL CARE IF:  Your pain increases, and medicine  does not help.  Your joint pain does not improve within 3 days.  You have increased bruising or swelling.  You have a fever.  You lose 10 lb (4.5 kg) or more without trying. SEEK IMMEDIATE MEDICAL CARE IF:  You are not able to move the joint.  Your fingers or toes become numb or they turn cold and blue.   This information is not intended to replace advice given to you by your health care provider. Make sure you discuss any questions you have with your health care provider.   Document Released: 02/18/2005 Document Revised: 03/11/2014 Document Reviewed: 11/30/2013 Elsevier Interactive Patient Education 2016 [employer].   Please read attached information. If you experience any new or worsening signs or symptoms please return to the emergency room for evaluation. Please follow-up with your primary care provider or specialist as discussed. Please use medication prescribed only as directed and discontinue taking if you have any concerning signs or symptoms.

## 2015-03-27 ENCOUNTER — Encounter: Payer: Self-pay | Admitting: Internal Medicine

## 2015-03-27 ENCOUNTER — Ambulatory Visit (INDEPENDENT_AMBULATORY_CARE_PROVIDER_SITE_OTHER): Payer: BLUE CROSS/BLUE SHIELD | Admitting: Internal Medicine

## 2015-03-27 VITALS — BP 110/68 | HR 74 | Wt 157.0 lb

## 2015-03-27 DIAGNOSIS — K219 Gastro-esophageal reflux disease without esophagitis: Secondary | ICD-10-CM

## 2015-03-27 DIAGNOSIS — K5909 Other constipation: Secondary | ICD-10-CM

## 2015-03-27 DIAGNOSIS — R1084 Generalized abdominal pain: Secondary | ICD-10-CM | POA: Diagnosis not present

## 2015-03-27 MED ORDER — OMEPRAZOLE 20 MG PO CPDR: 20.0000 mg | DELAYED_RELEASE_CAPSULE | Freq: Every day | ORAL | Status: DC

## 2015-03-27 MED ORDER — NA SULFATE-K SULFATE-MG SULF 17.5-3.13-1.6 GM/177ML PO SOLN: 1.0000 | Freq: Once | ORAL | Status: DC

## 2015-03-27 NOTE — Progress Notes (Signed)
HISTORY OF PRESENT ILLNESS:  Michelle Barr is a 55 y.o. [place]  female who was sent by her primary care provider Chana Bode PA-C regarding a chief complaint of chronic abdominal pain, constipation, and the need for endoscopy. The patient is accompanied by her husband. Her English skills are poor. First, she complains of generalized abdominal discomfort that is worse with constipation. She has had this problem for years. She is undergone multiple CT scans which have been negative for acute abnormalities. She does not take anything for her bowels. Reluctant to use medications. Did have Amitiza prescribed did not try. Next, chronic problems with GERD as manifested by pyrosis. No dysphagia. Occasional nausea with vomiting. No hematemesis, melena, or hematochezia. No weight loss. No family history of colon cancer as best we can tell. Outside records and laboratories reviewed.  REVIEW OF SYSTEMS:  All non-GI ROS negative upon comprehensive review  Past Medical History  Diagnosis Date  . Anemia 10 years ago    no medication  . Impaired fasting blood sugar   . Chronic abdominal pain   . Encounter for routine gynecological examination     sees gynecology    Past Surgical History  Procedure Laterality Date  . No past surgeries  01/2015    Social History [name]  reports that she has never smoked. She does not have any smokeless tobacco history on file. She reports that she does not drink alcohol or use illicit drugs.  family history is not on file.  No Known Allergies     PHYSICAL EXAMINATION: Vital signs: BP 110/68 mmHg  Pulse 74  Wt 157 lb (71.215 kg)  LMP 03/04/2006  Constitutional: generally well-appearing, no acute distress Psychiatric: alert and oriented x3, cooperative Eyes: extraocular movements intact, anicteric, conjunctiva pink Mouth: oral pharynx moist, no lesions Neck: supple no lymphadenopathy Cardiovascular: heart regular rate and rhythm, no murmur Lungs: clear to  auscultation bilaterally Abdomen: soft, nontender, nondistended, no obvious ascites, no peritoneal signs, normal bowel sounds, no organomegaly Rectal: Extremities: no lower extremity edema bilaterally Skin: no lesions on visible extremities Neuro: No focal deficits. No asterixis.    ASSESSMENT:  #1. Chronic constipation #2. Chronic abdominal pain secondary to constipation #3. Colon cancer screening. Baseline risk #4. Chronic GERD with intermittent nausea and vomiting   PLAN:  #1. Recommended MiraLAX daily for constipation. Titrated to achieve desired effect #2. Schedule screening colonoscopy.The nature of the procedure, as well as the risks, benefits, and alternatives were carefully and thoroughly reviewed with the patient. Ample time for discussion and questions allowed. The patient understood, was satisfied, and agreed to proceed. #3. Reflux precautions #4. Prescribe omeprazole 20 mg daily for chronic GERD #5. Schedule upper endoscopy to evaluate chronic GERD and upper GI symptoms.The nature of the procedure, as well as the risks, benefits, and alternatives were carefully and thoroughly reviewed with the patient. Ample time for discussion and questions allowed. The patient understood, was satisfied, and agreed to proceed.  A copy this consultation has been sent to [employer] PA-C

## 2015-03-27 NOTE — Patient Instructions (Signed)
You have been scheduled for a colonoscopy. Please follow written instructions given to you at your visit today.  Please pick up your prep supplies at the pharmacy within the next 1-3 days. If you use inhalers (even only as needed), please bring them with you on the day of your procedure.  We have sent the following medications to your pharmacy for you to pick up at your convenience:  Omeprazole

## 2015-04-05 ENCOUNTER — Ambulatory Visit (AMBULATORY_SURGERY_CENTER): Payer: BLUE CROSS/BLUE SHIELD | Admitting: Internal Medicine

## 2015-04-05 ENCOUNTER — Encounter: Payer: Self-pay | Admitting: Internal Medicine

## 2015-04-05 VITALS — BP 119/73 | HR 61 | Temp 97.2°F | Resp 15 | Ht 62.0 in | Wt 157.0 lb

## 2015-04-05 DIAGNOSIS — K219 Gastro-esophageal reflux disease without esophagitis: Secondary | ICD-10-CM | POA: Diagnosis not present

## 2015-04-05 DIAGNOSIS — D125 Benign neoplasm of sigmoid colon: Secondary | ICD-10-CM

## 2015-04-05 DIAGNOSIS — K5909 Other constipation: Secondary | ICD-10-CM

## 2015-04-05 DIAGNOSIS — R1013 Epigastric pain: Secondary | ICD-10-CM

## 2015-04-05 DIAGNOSIS — Z1211 Encounter for screening for malignant neoplasm of colon: Secondary | ICD-10-CM | POA: Diagnosis not present

## 2015-04-05 MED ORDER — SODIUM CHLORIDE 0.9 % IV SOLN: 500.0000 mL | INTRAVENOUS | Status: DC

## 2015-04-05 NOTE — Op Note (Signed)
[facility]  [address]. [place], [id number]   ENDOSCOPY PROCEDURE REPORT  PATIENT: Michelle Barr, Michelle Barr  MR#: [id number] BIRTHDATE: [birth year] , 28  yrs. old GENDER: female ENDOSCOPIST: Eustace Quail, MD REFERRED BY:  Dorothea Ogle, PA-C PROCEDURE DATE:  04/05/2015 PROCEDURE:  EGD, diagnostic ASA CLASS:     Class II INDICATIONS:  epigastric pain and history of esophageal reflux. MEDICATIONS: Monitored anesthesia care and Propofol 50 mg IV TOPICAL ANESTHETIC: none  DESCRIPTION OF PROCEDURE: After the risks benefits and alternatives of the procedure were thoroughly explained, informed consent was obtained.  The LB [id number] endoscope was introduced through the mouth and advanced to the second portion of the duodenum , Without limitations.  The instrument was slowly withdrawn as the mucosa was fully examined.  EXAM: The esophagus and gastroesophageal junction were completely normal in appearance.  The stomach was entered and closely examined.The antrum, angularis, and lesser curvature were well visualized, including a retroflexed view of the cardia and fundus. The stomach wall was normally distensable.  The scope passed easily through the pylorus into the duodenum.  Retroflexed views revealed a hiatal hernia.     The scope was then withdrawn from the patient and the procedure completed.  COMPLICATIONS: There were no immediate complications.  ENDOSCOPIC IMPRESSION: 1. Normal EGD 2. GERD  RECOMMENDATIONS: 1.  Anti-reflux regimen to be followed 2.  Continue omeprazole daily (previously prescribed) 3. Return to the care of your primary provider. GI follow-up as needed  REPEAT EXAM:  eSigned:  Eustace Quail, MD 04/05/2015 2:35 PM    CC:The Patient  ; Dorothea Ogle, PAC

## 2015-04-05 NOTE — Progress Notes (Signed)
Called to room to assist during endoscopic procedure.  Patient ID and intended procedure confirmed with present staff. Received instructions for my participation in the procedure from the performing physician.  

## 2015-04-05 NOTE — Op Note (Signed)
[facility]  [address]. [place], [id number]   COLONOSCOPY PROCEDURE REPORT  PATIENT: Michelle Barr, Michelle Barr  MR#: [id number] BIRTHDATE: 1960/06/10 , 10  yrs. old GENDER: female ENDOSCOPIST: Eustace Quail, MD REFERRED [id number] Tysinger, PA-C PROCEDURE DATE:  04/05/2015 PROCEDURE:   Colonoscopy, screening and Colonoscopy with snare polypectomy x 1 First Screening Colonoscopy - Avg.  risk and is 50 yrs.  old or older Yes.  Prior Negative Screening - Now for repeat screening. N/A  History of Adenoma - Now for follow-up colonoscopy & has been > or = to 3 yrs.  N/A  Polyps removed today? Yes ASA CLASS:   Class II INDICATIONS:Screening for colonic neoplasia and Colorectal Neoplasm Risk Assessment for this procedure is average risk. MEDICATIONS: Monitored anesthesia care and Propofol 250 mg IV  DESCRIPTION OF PROCEDURE:   After the risks benefits and alternatives of the procedure were thoroughly explained, informed consent was obtained.  The digital rectal exam revealed no abnormalities of the rectum.   The LB [id number]  endoscope was introduced through the anus and advanced to the cecum, which was identified by both the appendix and ileocecal valve. No adverse events experienced.   The quality of the prep was excellent. (Suprep was used)  The instrument was then slowly withdrawn as the colon was fully examined. Estimated blood loss is zero unless otherwise noted in this procedure report.  COLON FINDINGS: A single polyp measuring 5 mm in size was found in the sigmoid colon.  A polypectomy was performed with a cold snare. The resection was complete, the polyp tissue was completely retrieved and sent to histology.   The examination was otherwise normal.  Retroflexed views revealed internal hemorrhoids. The time to cecum = 2.4 Withdrawal time = 11.9   The scope was withdrawn and the procedure completed. COMPLICATIONS: There were no immediate complications.  ENDOSCOPIC  IMPRESSION: 1.   Single polyp was found in the sigmoid colon; polypectomy was performed with a cold snare 2.   The examination was otherwise normal  RECOMMENDATIONS: 1.  Repeat colonoscopy in 5 years if polyp adenomatous; otherwise 10 years 2.  Upper endoscopy today (please see report) 3. Continue MiraLAX for constipation.  eSigned:  Eustace Quail, MD 04/05/2015 2:31 PM   cc: The Patient ; Dorothea Ogle, PAC

## 2015-04-05 NOTE — Progress Notes (Signed)
A/ox3 pleased with MAC, report to Sheila RN 

## 2015-04-05 NOTE — Patient Instructions (Signed)

## 2015-04-06 ENCOUNTER — Telehealth: Payer: Self-pay | Admitting: *Deleted

## 2015-04-06 NOTE — Telephone Encounter (Signed)
  Follow up Call-  Call back number 04/05/2015  Permission to leave phone message Yes     Patient questions:called [id number]- no machine, no message left

## 2015-04-11 ENCOUNTER — Encounter: Payer: Self-pay | Admitting: Internal Medicine

## 2015-11-16 IMAGING — CT CT ANGIO CHEST
1 of 2 series · 19 of 32 positions shown · IV contrast (omnipaque)
Comparison: Chest radiographs obtained earlier today. Previous
abdomen CT examinations, the most recent dated 11/09/2010.

CLINICAL DATA: Pt reports intermittent stabbing L sided chest pain
radiating to L shoulder and down L arm with sob. Seema she woke up
this am with the pain worse than it has been and with sob. Pain and
sob worse with exertion. Denies n/v, diaphoresis, dizziness

EXAM:
CT ANGIOGRAPHY CHEST WITH CONTRAST
TECHNIQUE: Multidetector CT imaging of the chest was performed using the
standard protocol during bolus administration of intravenous
contrast. Multiplanar CT image reconstructions and MIPs were
obtained to evaluate the vascular anatomy.
CONTRAST:  100mL OMNIPAQUE IOHEXOL 350 MG/ML SOLN

[Series 7: thins for pacs · axial · 0.69mm/px · z∈[-237,-48]mm · 19 of 209 slices shown]
[im 10/209  lung]
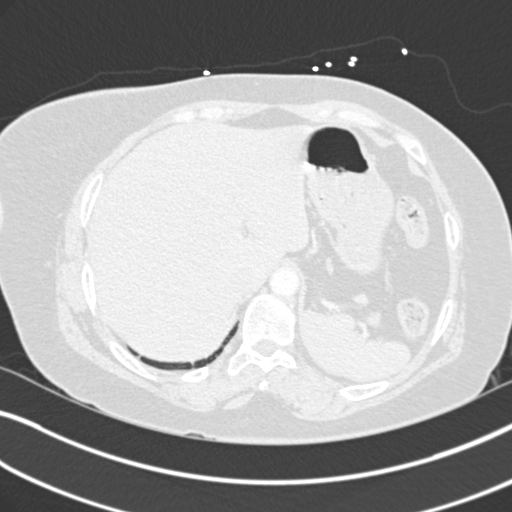
[im 19/209  soft-tissue]
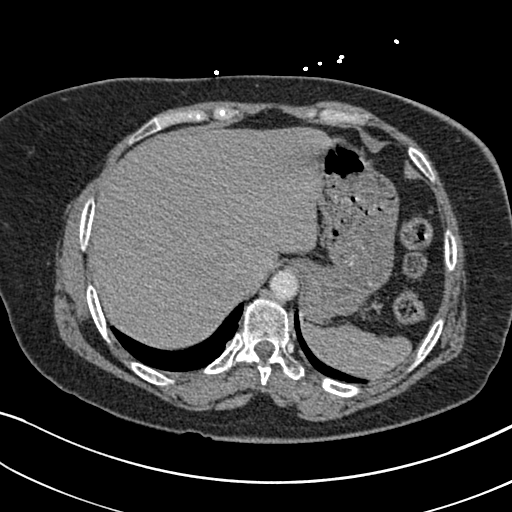
[im 28/209  lung]
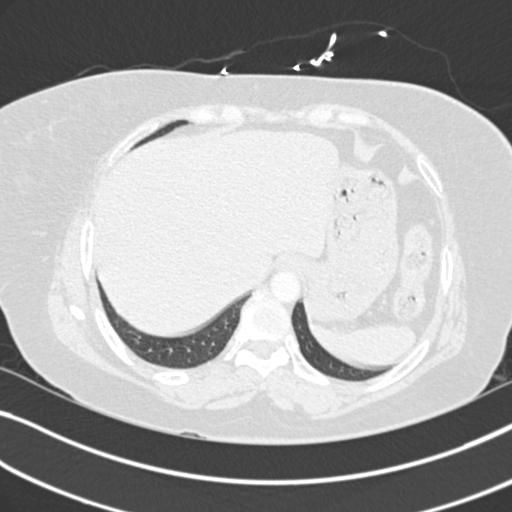
[im 46/209  soft-tissue]
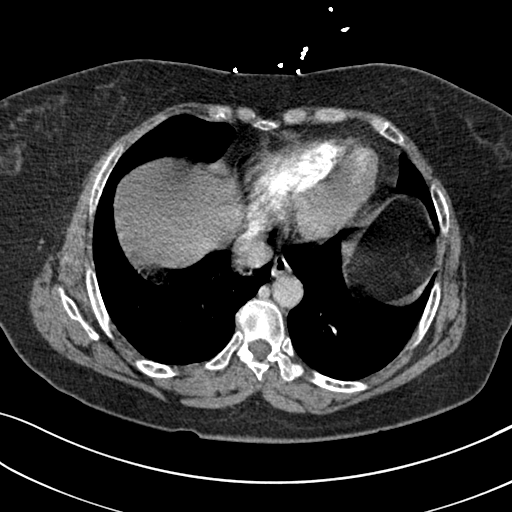
[im 55/209  lung]
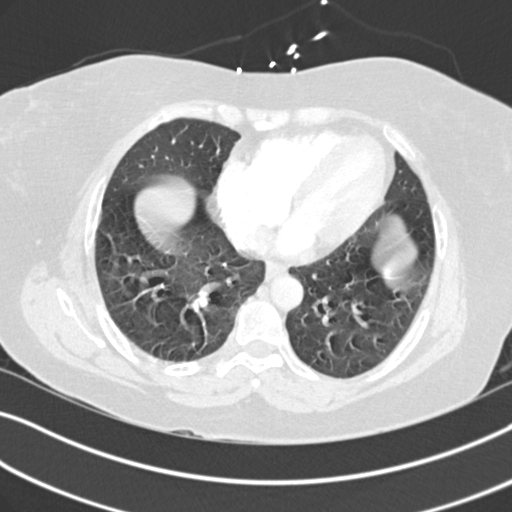
[im 64/209  soft-tissue]
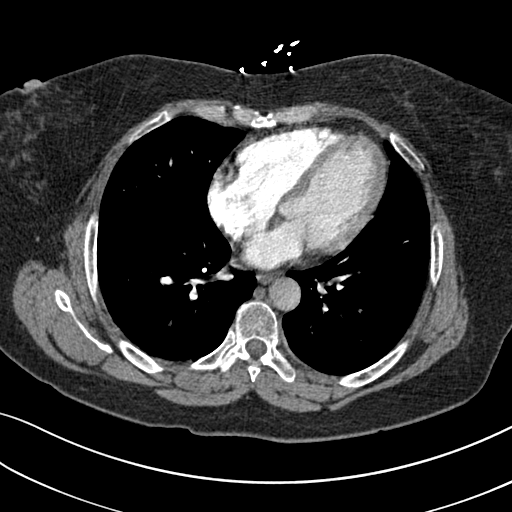
[im 73/209  lung]
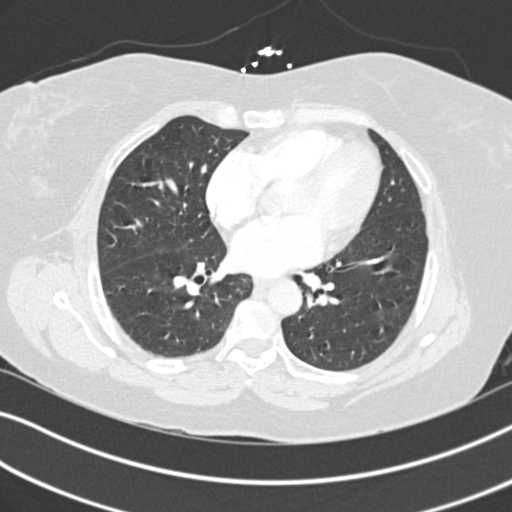
[im 82/209  soft-tissue]
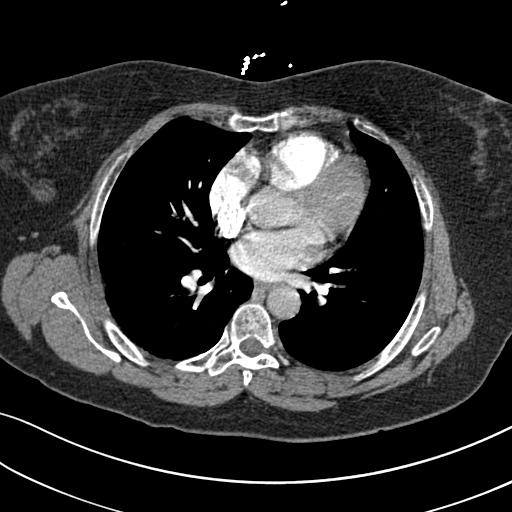
[im 91/209  lung]
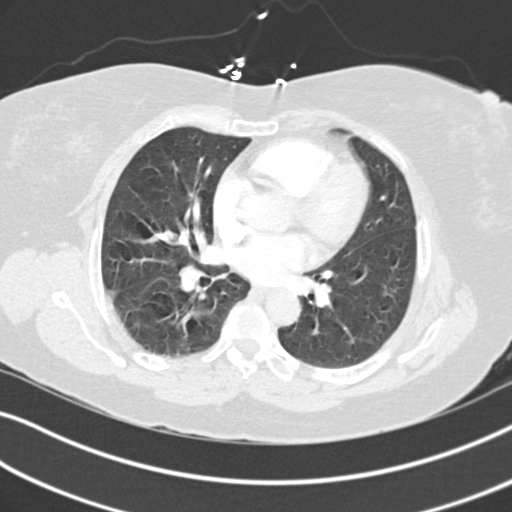
[im 109/209  soft-tissue]
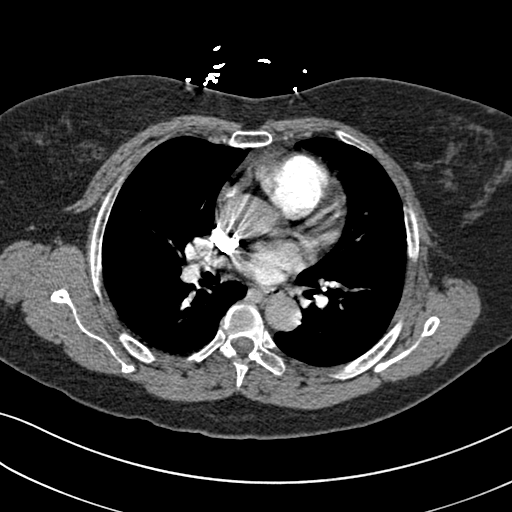
[im 118/209  lung]
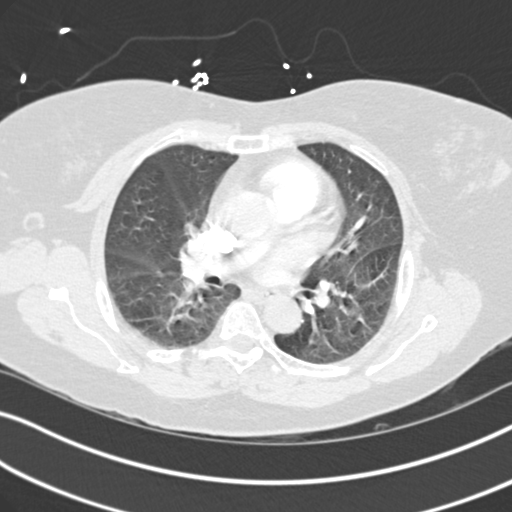
[im 127/209  soft-tissue]
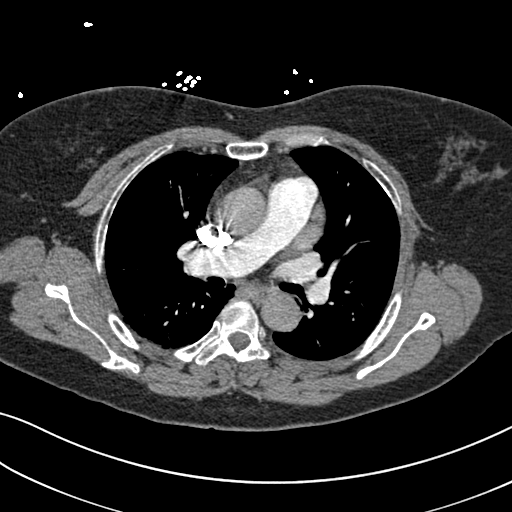
[im 136/209  lung]
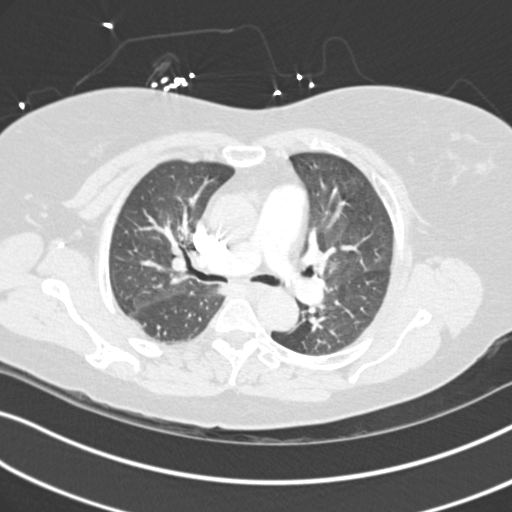
[im 145/209  soft-tissue]
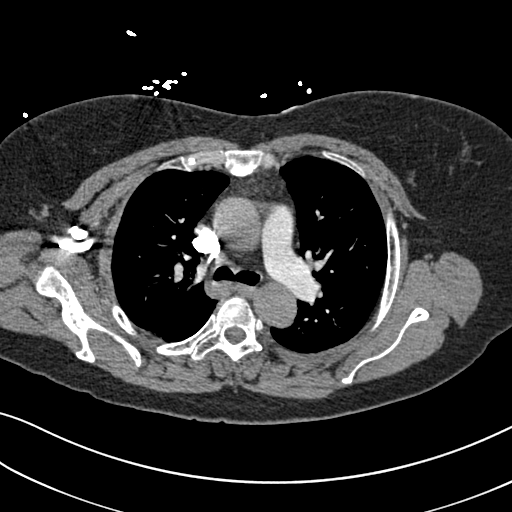
[im 154/209  lung]
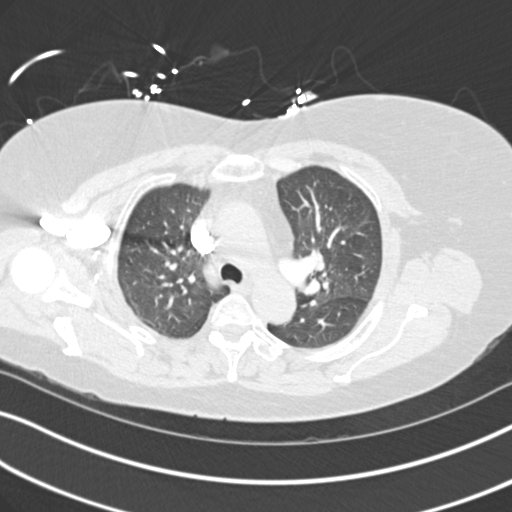
[im 163/209  soft-tissue]
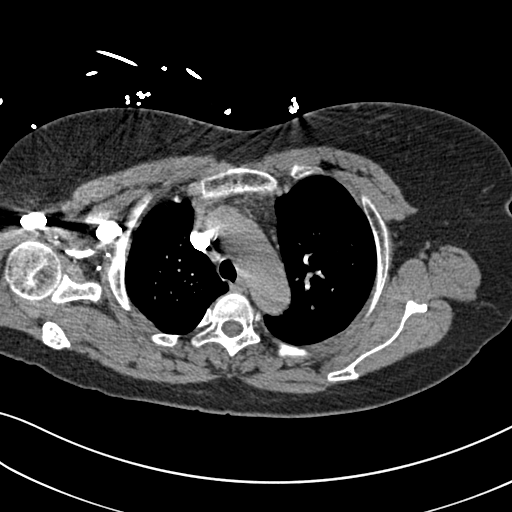
[im 181/209  lung]
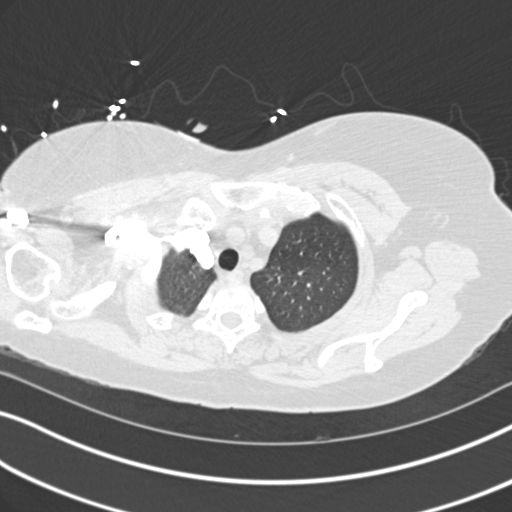
[im 190/209  soft-tissue]
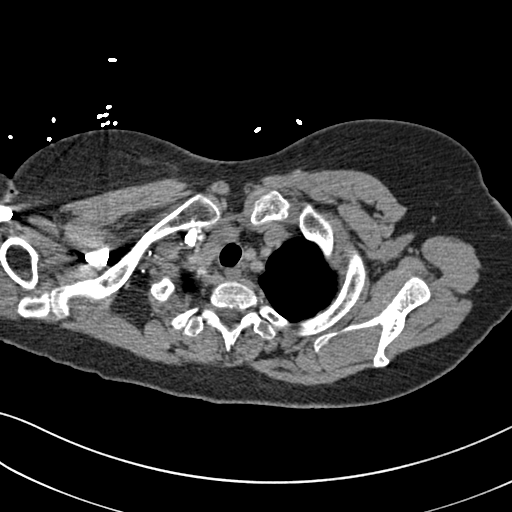
[im 199/209  lung]
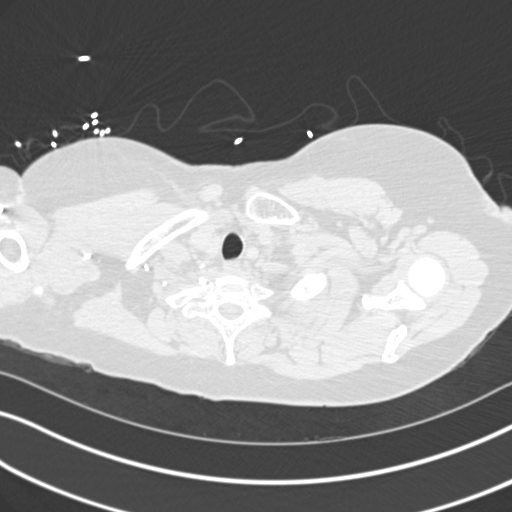

[19 of 32 positions shown; findings below may reference images not displayed]

FINDINGS: Normally opacified pulmonary arteries with no pulmonary arterial
filling defects seen. Clear lungs. 4 mm left lower lobe nodule on
image number 46. This is not seen on the previous abdomen CT
examinations. No enlarged lymph nodes. Mild lower thoracic spine
degenerative changes. Unremarkable upper abdomen.

Review of the MIP images confirms the above findings.
IMPRESSION: 1. No pulmonary emboli or acute abnormality.
2. Interval 4 mm left lower lobe nodule. If the patient is at high
risk for bronchogenic carcinoma, follow-up chest CT at 1 year is
recommended. If the patient is at low risk, no follow-up is needed.
This recommendation follows the consensus statement: Guidelines for
Management of Small Pulmonary Nodules Detected on CT Scans: A
Statement from the [HOSPITAL] as published in Radiology

## 2017-09-08 ENCOUNTER — Encounter: Payer: BLUE CROSS/BLUE SHIELD | Admitting: Medical

## 2017-10-27 ENCOUNTER — Encounter: Payer: Self-pay | Admitting: Medical

## 2017-11-10 ENCOUNTER — Encounter: Payer: Self-pay | Admitting: Medical

## 2019-05-02 ENCOUNTER — Encounter (HOSPITAL_COMMUNITY): Payer: Self-pay | Admitting: Emergency Medicine

## 2019-05-02 ENCOUNTER — Other Ambulatory Visit: Payer: Self-pay

## 2019-05-02 ENCOUNTER — Emergency Department (HOSPITAL_COMMUNITY)
Admission: EM | Admit: 2019-05-02 | Discharge: 2019-05-02 | Disposition: A | Discharge status: Home / Self Care | Payer: 59 | Attending: Emergency Medicine | Admitting: Emergency Medicine

## 2019-05-02 ENCOUNTER — Emergency Department (HOSPITAL_COMMUNITY): Payer: 59

## 2019-05-02 DIAGNOSIS — U071 COVID-19: Secondary | ICD-10-CM

## 2019-05-02 DIAGNOSIS — R5383 Other fatigue: Secondary | ICD-10-CM | POA: Diagnosis present

## 2019-05-02 LAB — CBC
HCT: 37.6 % (ref 36.0–46.0)
Hemoglobin: 12.7 g/dL (ref 12.0–15.0)
MCH: 28.3 pg (ref 26.0–34.0)
MCHC: 33.8 g/dL (ref 30.0–36.0)
MCV: 83.9 fL (ref 80.0–100.0)
Platelets: 206 10*3/uL (ref 150–400)
RBC: 4.48 MIL/uL (ref 3.87–5.11)
RDW: 14.6 % (ref 11.5–15.5)
WBC: 6.5 10*3/uL (ref 4.0–10.5)
nRBC: 0 % (ref 0.0–0.2)

## 2019-05-02 LAB — BASIC METABOLIC PANEL
Anion gap: 8 (ref 5–15)
BUN: 13 mg/dL (ref 6–20)
CO2: 26 mmol/L (ref 22–32)
Calcium: 8.5 mg/dL — ABNORMAL LOW (ref 8.9–10.3)
Chloride: 102 mmol/L (ref 98–111)
Creatinine, Ser: 0.6 mg/dL (ref 0.44–1.00)
GFR calc Af Amer: >60 mL/min (ref >60)
GFR calc non Af Amer: >60 mL/min (ref >60)
Glucose, Bld: 107 mg/dL — ABNORMAL HIGH (ref 70–99)
Potassium: 4.1 mmol/L (ref 3.5–5.1)
Sodium: 136 mmol/L (ref 135–145)

## 2019-05-02 MED ORDER — ACETAMINOPHEN 325 MG PO TABS
650.0000 mg | ORAL_TABLET | Freq: Once | ORAL | Status: AC
  Administered 2019-05-02: 650 mg via ORAL
  Filled 2019-05-02: qty 2

## 2019-05-02 NOTE — Discharge Instructions (Signed)
It was our pleasure to provide your ER care today - we hope that you feel better.  See COVID information/instructions.  Drink plenty of fluids, eat balanced diet.  Take acetaminophen or ibuprofen as need for fever and body aches.  Return to ER if worse, new symptoms, increased difficulty breathing, or other concern.

## 2019-05-02 NOTE — ED Notes (Signed)
[place], husband

## 2019-05-02 NOTE — ED Triage Notes (Signed)
Per EMS: Patient reports to the ER via EMS. Patient reportedly tested COVID+ on Feb. 22nd. Patient reports she started feeling bad on the 20th. Patient's O2 sats on RA are 94-95%. Patient speaks [place].

## 2019-05-02 NOTE — ED Notes (Signed)
Patient's husband is on his way to pick patient up. RN instructed patient's spouse to call RN when he arrived and patient will be wheeled out to him.

## 2019-05-02 NOTE — ED Provider Notes (Addendum)
[facility] DEPT Provider Note   CSN: [id number] Arrival date & time: 05/02/19  [id number]     History Chief Complaint  Patient presents with  . COVID Positive  . Fatigue    Michelle Barr is a 59 y.o. female.  Patient with covid test + on 2/22, c/o general malaise, poor appetite, persistent non prod cough, mild sob, chest soreness w coughing. Symptoms acute onset 1 week ago, moderate, persistent, feels worse in past 1-2 days. No vomiting or diarrhea. +decreased po intake due to poor appetite. No fainting/syncope.   The history is provided by the patient. A language interpreter was used.       History reviewed. No pertinent past medical history.  There are no problems to display for this patient.   History reviewed. No pertinent surgical history.   OB History   No obstetric history on file.     History reviewed. No pertinent family history.  Social History   Tobacco Use  . Smoking status: Not on file  Substance Use Topics  . Alcohol use: Not on file  . Drug use: Not on file    Home Medications Prior to Admission medications   Not on File    Allergies    Patient has no allergy information on record.  Review of Systems   Review of Systems  Constitutional: Positive for fever.  HENT: Negative for sore throat.   Eyes: Negative for redness.  Respiratory: Positive for cough and shortness of breath.   Cardiovascular: Negative for leg swelling.  Gastrointestinal: Negative for abdominal pain and vomiting.  Genitourinary: Negative for dysuria.  Musculoskeletal: Positive for myalgias. Negative for neck pain and neck stiffness.  Skin: Negative for rash.  Neurological: Negative for headaches.  Hematological: Does not bruise/bleed easily.  Psychiatric/Behavioral: Negative for confusion.    Physical Exam Updated Vital Signs BP 108/69 (BP Location: Right Arm)   Pulse 78   Temp 99 F (37.2 C) (Oral)   Resp 17   Wt 70.3 kg   SpO2  96%   Physical Exam Vitals and nursing note reviewed.  Constitutional:      Appearance: Normal appearance. She is well-developed.  HENT:     Head: Atraumatic.     Nose: Nose normal.     Mouth/Throat:     Mouth: Mucous membranes are moist.     Pharynx: Oropharynx is clear.  Eyes:     General: No scleral icterus.    Conjunctiva/sclera: Conjunctivae normal.     Pupils: Pupils are equal, round, and reactive to light.  Neck:     Trachea: No tracheal deviation.  Cardiovascular:     Rate and Rhythm: Normal rate and regular rhythm.     Pulses: Normal pulses.     Heart sounds: Normal heart sounds. No murmur. No friction rub. No gallop.   Pulmonary:     Effort: Pulmonary effort is normal. No respiratory distress.     Breath sounds: Normal breath sounds.  Abdominal:     General: Bowel sounds are normal. There is no distension.     Palpations: Abdomen is soft.     Tenderness: There is no abdominal tenderness. There is no guarding.  Genitourinary:    Comments: No cva tenderness.  Musculoskeletal:        General: No swelling or tenderness.     Cervical back: Normal range of motion and neck supple. No rigidity. No muscular tenderness.  Skin:    General: Skin is warm and dry.  Findings: No rash.  Neurological:     Mental Status: She is alert.     Comments: Alert, speech normal.   Psychiatric:        Mood and Affect: Mood normal.     ED Results / Procedures / Treatments   Labs (all labs ordered are listed, but only abnormal results are displayed) Results for orders placed or performed during the hospital encounter of [id number]  Basic metabolic panel  Result Value Ref Range   Sodium 136 135 - 145 mmol/L   Potassium 4.1 3.5 - 5.1 mmol/L   Chloride 102 98 - 111 mmol/L   CO2 26 22 - 32 mmol/L   Glucose, Bld 107 (H) 70 - 99 mg/dL   BUN 13 6 - 20 mg/dL   Creatinine, Ser 0.60 0.44 - 1.00 mg/dL   Calcium 8.5 (L) 8.9 - 10.3 mg/dL   GFR calc non Af Amer >60 >60 mL/min   GFR calc Af  Amer >60 >60 mL/min   Anion gap 8 5 - 15  CBC  Result Value Ref Range   WBC 6.5 4.0 - 10.5 K/uL   RBC 4.48 3.87 - 5.11 MIL/uL   Hemoglobin 12.7 12.0 - 15.0 g/dL   HCT 37.6 36.0 - 46.0 %   MCV 83.9 80.0 - 100.0 fL   MCH 28.3 26.0 - 34.0 pg   MCHC 33.8 30.0 - 36.0 g/dL   RDW 14.6 11.5 - 15.5 %   Platelets 206 150 - 400 K/uL   nRBC 0.0 0.0 - 0.2 %   DG Chest Port 1 View  Result Date: 05/02/2019 CLINICAL DATA:  59 year old female with cough and shortness of breath. COVID positive. EXAM: PORTABLE CHEST 1 VIEW COMPARISON:  03/20/2014 chest radiograph FINDINGS: The cardiomediastinal silhouette is unremarkable. Opacity overlying the LATERAL RIGHT mid lung noted likely represents pneumonia. No definite pleural effusion or pneumothorax. No acute bony abnormalities are present. IMPRESSION: RIGHT lung opacity likely representing pneumonia. Radiographic follow-up to resolution is recommended. Electronically Signed   By: Margarette Canada M.D.   On: 05/02/2019 11:03    ED ECG REPORT   Date: 05/02/2019  Rate: 77  Rhythm: normal sinus rhythm  QRS Axis: normal  Intervals: normal  ST/T Wave abnormalities: normal  Conduction Disutrbances:none  Narrative Interpretation:   Old EKG Reviewed: none available  I have personally reviewed the EKG tracing  Procedures Procedures (including critical care time)  Medications Ordered in ED Medications - No data to display  ED Course  I have reviewed the triage vital signs and the nursing notes.  Pertinent labs & imaging results that were available during my care of the patient were reviewed by me and considered in my medical decision making (see chart for details).    MDM Rules/Calculators/A&P                      Labs sent. CXR ordered.   Reviewed nursing notes and prior charts for additional history.   Po fluids.   Xrays reviewed/interpreted by me - ?infiltrate, ?viral/covid pna.   Labs reviewed/interpreted by me - chem normal.  Michelle Barr was evaluated in Emergency Department on 05/02/2019 for the symptoms described in the history of present illness. She was evaluated in the context of the global COVID-19 pandemic, which necessitated consideration that the patient might be at risk for infection with the SARS-CoV-2 virus that causes COVID-19. Institutional protocols and algorithms that pertain to the evaluation of patients at risk for  COVID-19 are in a state of rapid change based on information released by regulatory bodies including the CDC and federal and state organizations. These policies and algorithms were followed during the patient's care in the ED.  Patient is not hypoxic, vitals normal, is tolerating po.  Lab and xray results, and d/c plan, including return precautions discussed via interpreter line.   Patient currently appears stable for d/c.   Return precautions provided.     Final Clinical Impression(s) / ED Diagnoses Final diagnoses:  None    Rx / DC Orders ED Discharge Orders    None           Lajean Saver, MD 05/02/19 1147

## 2019-05-03 ENCOUNTER — Encounter: Payer: Self-pay | Admitting: Internal Medicine

## 2019-07-12 ENCOUNTER — Ambulatory Visit: Payer: 59 | Attending: Internal Medicine

## 2019-07-12 DIAGNOSIS — Z23 Encounter for immunization: Secondary | ICD-10-CM

## 2019-07-12 NOTE — Progress Notes (Signed)
   Covid-19 Vaccination Clinic  Name:  Michelle Barr    MRN: [id number] DOB: Feb 03, 1961  07/12/2019  [name] was observed post Covid-19 immunization for 15 minutes without incident. She was provided with Vaccine Information Sheet and instruction to access the V-Safe system.   [name] was instructed to call 911 with any severe reactions post vaccine: Marland Kitchen Difficulty breathing  . Swelling of face and throat  . A fast heartbeat  . A bad rash all over body  . Dizziness and weakness   Immunizations Administered    Name Date Dose VIS Date Route   Pfizer COVID-19 Vaccine 07/12/2019 10:07 AM 0.3 mL 04/28/2018 Intramuscular   Manufacturer: [employer], [employer]   Lot: [id number]   [place]: [id number]

## 2020-10-03 ENCOUNTER — Encounter: Payer: Self-pay | Admitting: Internal Medicine

## 2020-10-04 ENCOUNTER — Telehealth: Payer: Self-pay | Admitting: *Deleted

## 2020-10-04 NOTE — Telephone Encounter (Signed)
Pt did not show for her 330 pm 10-04-2020 PV- the Interpreter was here and pt did not show- Attempted to call pt at 340 pm- no answer, no voice mail-  attempted pt again at 415 pm, no answer no voicemail  Mailed pt a no show letter , cancelled PV and Colon 8-16  Lelan Pons PV

## 2020-10-07 IMAGING — DX DG CHEST 1V PORT
1 series · 1 of 1 positions shown · non-contrast
Comparison: 03/20/2014 chest radiograph

CLINICAL DATA: 58-year-old female with cough and shortness of
breath. COVID positive.

EXAM:
PORTABLE CHEST 1 VIEW

[chest ap]
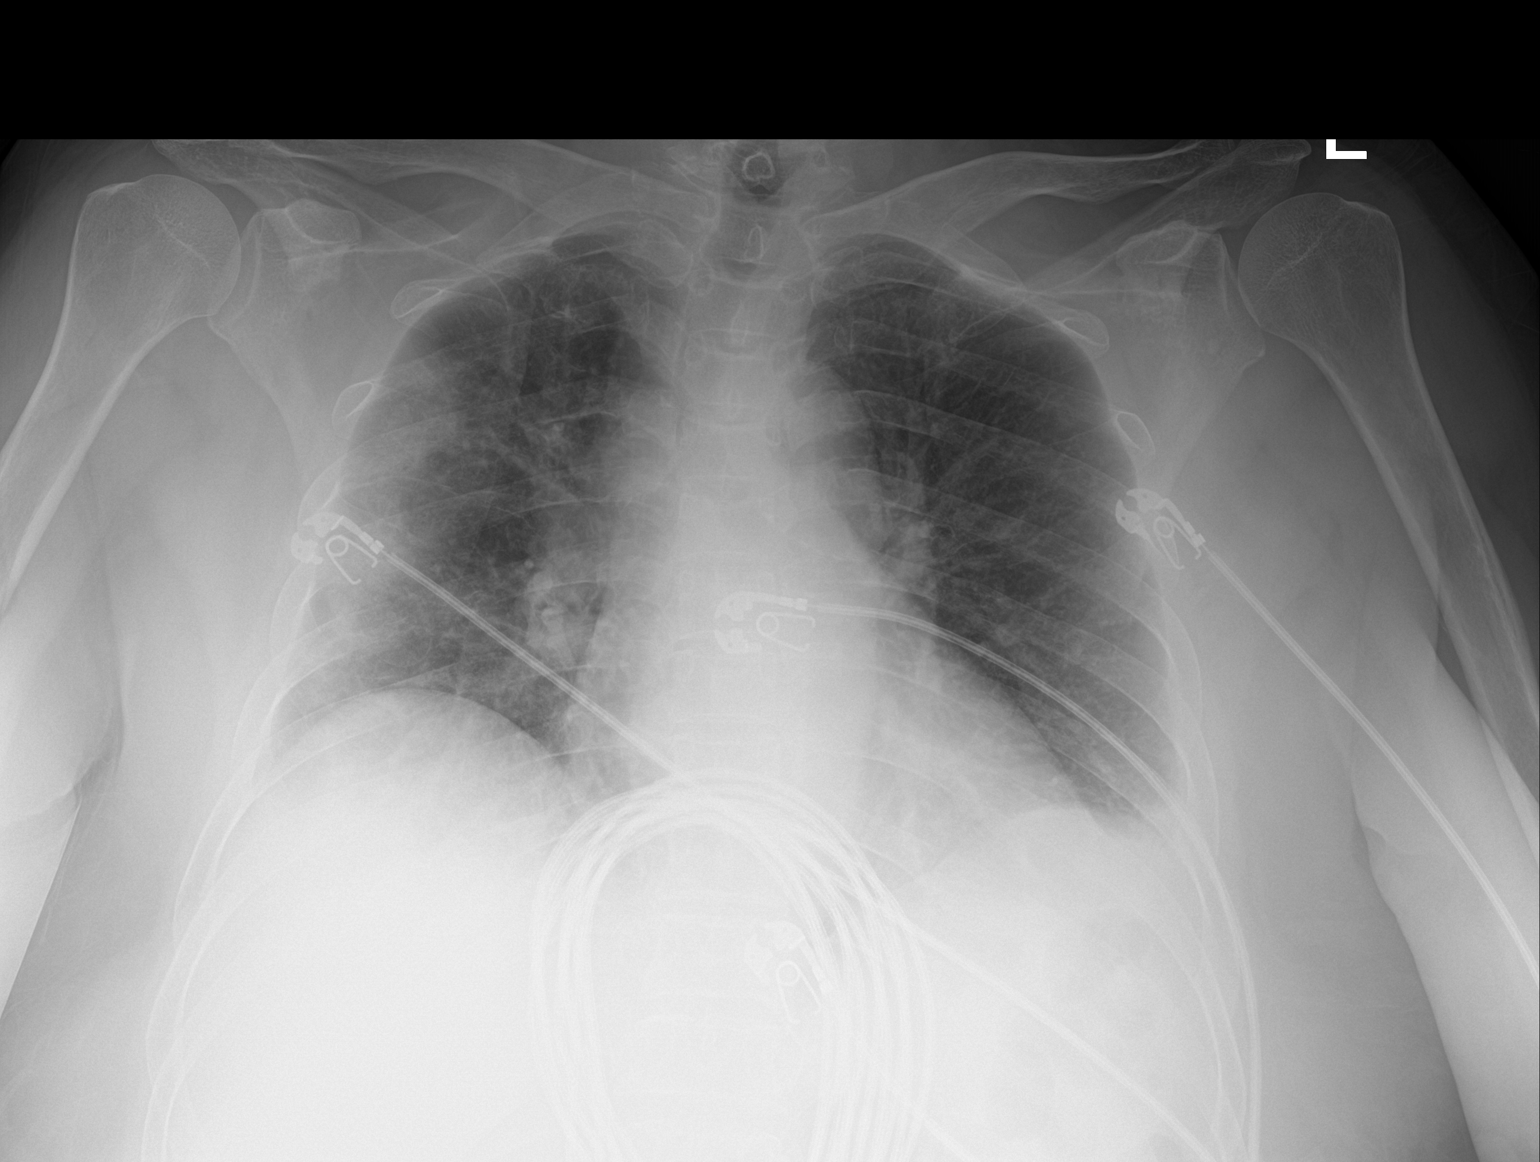

[1 of 1 positions shown; findings below may reference images not displayed]

FINDINGS: The cardiomediastinal silhouette is unremarkable.

Opacity overlying the LATERAL RIGHT mid lung noted likely represents
pneumonia.

No definite pleural effusion or pneumothorax.

No acute bony abnormalities are present.
IMPRESSION: RIGHT lung opacity likely representing pneumonia. Radiographic
follow-up to resolution is recommended.

## 2020-10-17 ENCOUNTER — Encounter: Payer: 59 | Admitting: Gastroenterology

## 2021-01-11 DIAGNOSIS — K219 Gastro-esophageal reflux disease without esophagitis: Secondary | ICD-10-CM | POA: Insufficient documentation

## 2022-03-28 ENCOUNTER — Ambulatory Visit: Payer: 59 | Admitting: Family Medicine

## 2022-04-09 ENCOUNTER — Ambulatory Visit: Payer: 59 | Admitting: Family Medicine

## 2022-04-09 ENCOUNTER — Encounter: Payer: Self-pay | Admitting: Family Medicine

## 2022-04-09 VITALS — BP 134/78 | HR 77 | Temp 97.8°F | Ht 61.0 in | Wt 170.2 lb

## 2022-04-09 DIAGNOSIS — E785 Hyperlipidemia, unspecified: Secondary | ICD-10-CM | POA: Diagnosis not present

## 2022-04-09 DIAGNOSIS — R1310 Dysphagia, unspecified: Secondary | ICD-10-CM | POA: Diagnosis not present

## 2022-04-09 DIAGNOSIS — M79671 Pain in right foot: Secondary | ICD-10-CM | POA: Insufficient documentation

## 2022-04-09 MED ORDER — DICLOFENAC SODIUM 1 % EX GEL: 4.0000 g | Freq: Four times a day (QID) | CUTANEOUS | 3 refills | Status: DC | PRN

## 2022-04-09 MED ORDER — ROSUVASTATIN CALCIUM 5 MG PO TABS: 5.0000 mg | ORAL_TABLET | Freq: Every day | ORAL | 3 refills | Status: DC

## 2022-04-09 MED ORDER — ACETAMINOPHEN ER 650 MG PO TBCR: 650.0000 mg | EXTENDED_RELEASE_TABLET | Freq: Three times a day (TID) | ORAL | Status: AC | PRN
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
## 2022-04-09 NOTE — Assessment & Plan Note (Signed)
Patient reports intolerance to atorvastatin. Proposed to trial a different statin at a lower dose or consider alternative lipid-lowering agents, such as Rosuvastatin at a starting low dose. Monitor for GI side effects closely.

## 2022-04-09 NOTE — Assessment & Plan Note (Addendum)
Differential diagnosis:  Calcaneal spur Foot sprain Achilles tendinitis Arthritis Plan:  Recommend an X-ray to assess for bone spurs, calcaneal issues, or other abnormalities. Advise on supportive footwear and possibly custom orthotic inserts.  Recommend avoidance of oral NSAIDs, suggest OTC acetaminophen and trial of topical diclofenac gel for pain management. If symptoms persist or worsen, consider further evaluation possibly including MRI and referral to orthopedics.

## 2022-04-09 NOTE — Assessment & Plan Note (Signed)
Differential diagnosis: Esophageal strictures Gastroesophageal reflux disease (GERD) Esophageal neoplasm (less likely given the history)  Plan:  A referral to a gastroenterologist is recommended for further evaluation, including barium swallow or esophagogastroduodenoscopy (EGD) to rule out strictures or neoplasm. In the meantime, patient advice to avoid any foods known to trigger symptoms and over-the-counter antacids may be used as symptomatic relief.

## 2022-04-09 NOTE — Patient Instructions (Addendum)
It was a pleasure to meet you today.  For difficulty swallowing, we are referring to gastroenterology for endoscopy. The office will call to schedule the appointment.  For cholesterol, we are stopping atoravastin. We are starting rosuvastatin.  For heel pain, we are prescribing voltaren gel. You may also take tylenol. Please avoid drugs like ibuprofen or naproxen or aspirin as this can worsen stomach pain. You may also use supportive foot ware such as insoles.   For right foot xray, go to:    [facility], [place], [place], [place] [address] Phone: [phone]

## 2022-05-02 ENCOUNTER — Other Ambulatory Visit: Payer: Self-pay | Admitting: Family Medicine

## 2022-10-31 ENCOUNTER — Ambulatory Visit (INDEPENDENT_AMBULATORY_CARE_PROVIDER_SITE_OTHER): Payer: 59 | Admitting: Family Medicine

## 2022-10-31 ENCOUNTER — Encounter: Payer: Self-pay | Admitting: Family Medicine

## 2022-10-31 VITALS — BP 120/80 | HR 86 | Temp 98.0°F | Resp 16 | Ht 61.0 in | Wt 169.4 lb

## 2022-10-31 DIAGNOSIS — Z0000 Encounter for general adult medical examination without abnormal findings: Secondary | ICD-10-CM

## 2022-10-31 DIAGNOSIS — Z1159 Encounter for screening for other viral diseases: Secondary | ICD-10-CM

## 2022-10-31 DIAGNOSIS — E782 Mixed hyperlipidemia: Secondary | ICD-10-CM

## 2022-10-31 DIAGNOSIS — I951 Orthostatic hypotension: Secondary | ICD-10-CM

## 2022-10-31 DIAGNOSIS — K5909 Other constipation: Secondary | ICD-10-CM

## 2022-10-31 DIAGNOSIS — M7731 Calcaneal spur, right foot: Secondary | ICD-10-CM

## 2022-10-31 LAB — MICROALBUMIN / CREATININE URINE RATIO
Creatinine,U: 67.5 mg/dL
Microalb Creat Ratio: 1.5 mg/g (ref 0.0–30.0)
Microalb, Ur: 1 mg/dL (ref 0.0–1.9)

## 2022-10-31 NOTE — Assessment & Plan Note (Signed)
Increase fluid intake and dietary salt. Order blood work to rule out other causes of dizziness including B12, folate, CBC, thyroid function tests.

## 2022-10-31 NOTE — Assessment & Plan Note (Signed)
Patient previously on rosuvastatin; counsel on adherence to medication.

## 2022-10-31 NOTE — Patient Instructions (Addendum)
Stand up slowly to prevent dizziness and maintain your balance. Increase your fluid intake and consume more salt as recommended. Drink more water and consume fruits and vegetables to help with constipation. Consider over-the-counter laxatives such as MiraLAX if constipation persists. Schedule preventive screenings, including mammograms, and ensure vaccinations like flu shots are up to date.  ??ng ln t? t? ?? trnh chng m?t v gi? th?ng b?ng. T?ng l??ng ch?t l?ng c?a b?n v tiu th? nhi?u mu?i h?n theo khuy?n ngh?Marland Kitchen U?ng nhi?u n??c h?n v tiu th? tri cy v rau qu? ?? gip gi?m to bn. Cn nh?c s? d?ng thu?c nhu?n trng khng k ??n nh? MiraLAX n?u to bn ko di. Ln l?ch khm sng l?c phng ng?a, bao g?m ch?p quang tuy?n v v ??m b?o tim ch?ng nh? tim phng cm ???c c?p nh?t.

## 2022-10-31 NOTE — Progress Notes (Signed)
Assessment  Assessment/Plan:   Problem List Items Addressed This Visit       Cardiovascular and Mediastinum   Orthostatic hypotension    Increase fluid intake and dietary salt. Order blood work to rule out other causes of dizziness including B12, folate, CBC, thyroid function tests.      Relevant Orders   TSH   CBC with Differential/Platelet   Comprehensive metabolic panel   Z61 and Folate Panel   EKG 12-Lead (Completed)   Urinalysis w microscopic + reflex cultur     Other   Constipation   Hyperlipidemia    Patient previously on rosuvastatin; counsel on adherence to medication.      Relevant Orders   Lipid panel   Hemoglobin A1c   Microalbumin / creatinine urine ratio   Comprehensive metabolic panel   Other Visit Diagnoses     Encounter for well adult exam without abnormal findings    -  Primary   Relevant Orders   MM DIGITAL SCREENING BILATERAL   Ambulatory referral to Obstetrics / Gynecology   Ambulatory referral to Gastroenterology   Calcaneal spur of right foot       Relevant Orders   Vitamin D 1,25 dihydroxy   Screening for viral disease       Relevant Orders   HCV Ab w Reflex to Quant PCR   HIV Antibody (routine testing w rflx)       Medications Discontinued During This Encounter  Medication Reason   lubiprostone (AMITIZA) 8 MCG capsule Patient Preference   omeprazole (PRILOSEC) 20 MG capsule Patient Preference   rosuvastatin (CRESTOR) 5 MG tablet Patient Preference    Patient Counseling(The following topics were reviewed and/or handout was given):  -Nutrition: Stressed importance of moderation in sodium/caffeine intake, saturated fat and cholesterol, caloric balance, sufficient intake of fresh fruits, vegetables, and fiber.  -Stressed the importance of regular exercise.   -Substance Abuse: Discussed cessation/primary prevention of tobacco, alcohol, or other drug use; driving or other dangerous activities under the influence; availability of  treatment for abuse.   -Injury prevention: Discussed safety belts, safety helmets, smoke detector, smoking near bedding or upholstery.   -Sexuality: Discussed sexually transmitted diseases, partner selection, use of condoms, avoidance of unintended pregnancy and contraceptive alternatives.   -Dental health: Discussed importance of regular tooth brushing, flossing, and dental visits.  -Health maintenance and immunizations reviewed. Please refer to Health maintenance section.  Return to care in 1 year for next preventative visit.       Subjective:  Chief complaint Encounter date: 10/31/2022  Chief Complaint  Patient presents with   Annual Exam    Had oatmeal with banana 8 am    Michelle Barr is a 62 y.o. female who presents today for her annual comprehensive physical exam.    Chief Complaint: Dizziness and constipation.  History of Present Illness:  Additionally, patient is presenting with complaints of ongoing dizziness for approximately 4-5 months, primarily occurring upon standing from a lying or seated position. The patient describes the dizziness as lightheadedness lasting about 1-2 minutes, which improves when she rises slowly. She denies other associated symptoms such as ringing in the ears, hearing loss, chest pain, numbness, tingling, nausea, vomiting, speech difficulties, or vision changes. She also reports intermittent headaches starting from a couple of months ago. Additionally, she experiences constipation which her daughter manages with dietary interventions like prune juice.     10/31/2022    1:35 PM  GAD-7 Generalized Anxiety Disorder Screening Tool  1.  Feeling Nervous, Anxious, or on Edge 0  2. Not Being Able to Stop or Control Worrying 0  3. Worrying Too Much About Different Things 0  4. Trouble Relaxing 0  5. Being So Restless it's Hard To Sit Still 0  6. Becoming Easily Annoyed or Irritable 0  7. Feeling Afraid As If Something Awful Might Happen 0  Total  GAD-7 Score 0  Difficulty At Work, Home, or Getting  Along With Others? Not difficult at all      10/31/2022    1:35 PM  Depression screen PHQ 2/9  Decreased Interest 0  Down, Depressed, Hopeless 0  PHQ - 2 Score 0  Altered sleeping 0  Tired, decreased energy 0  Change in appetite 0  Feeling bad or failure about yourself  0  Trouble concentrating 0  Moving slowly or fidgety/restless 0  Suicidal thoughts 0  PHQ-9 Score 0  Difficult doing work/chores Not difficult at all    Health Maintenance Due  Topic Date Due   HIV Screening  Never done   Hepatitis C Screening  Never done   MAMMOGRAM  Never done   Zoster Vaccines- Shingrix (1 of 2) Never done   PAP SMEAR-Modifier  04/09/2016   Colonoscopy  04/04/2020   COVID-19 Vaccine (2 - 2023-24 season) 11/02/2021   INFLUENZA VACCINE  10/03/2022       PMH:  The following were reviewed and entered/updated in epic: Past Medical History:  Diagnosis Date   Anemia 10 years ago   no medication   Chronic abdominal pain    Encounter for routine gynecological examination    sees gynecology   Hyperlipidemia    Impaired fasting blood sugar    Shortness of breath     Patient Active Problem List   Diagnosis Date Noted   Orthostatic hypotension 10/31/2022   Dysphagia 04/09/2022   Hyperlipidemia 04/09/2022   Pain of right heel 04/09/2022   Gastroesophageal reflux disease without esophagitis 01/11/2021   Annual physical exam 01/11/2015   Generalized abdominal pain 01/11/2015   Impaired fasting glucose 01/11/2015   Special screening for malignant neoplasms, colon 01/11/2015   Constipation 01/11/2015    Past Surgical History:  Procedure Laterality Date   NO PAST SURGERIES  01/2015    Family History  Problem Relation Age of Onset   Colon cancer Neg Hx    Stomach cancer Neg Hx     Medications- reviewed and updated Outpatient Medications Prior to Visit  Medication Sig Dispense Refill   acetaminophen (TYLENOL 8 HOUR) 650 MG  CR tablet Take 1 tablet (650 mg total) by mouth every 8 (eight) hours as needed for pain.     diclofenac Sodium (VOLTAREN) 1 % GEL Apply 4 g topically 4 (four) times daily as needed. 100 g 3   lubiprostone (AMITIZA) 8 MCG capsule Take 1 capsule (8 mcg total) by mouth 2 (two) times daily with a meal. 60 capsule 2   omeprazole (PRILOSEC) 20 MG capsule Take 1 capsule (20 mg total) by mouth daily. 30 capsule 6   rosuvastatin (CRESTOR) 5 MG tablet Take 1 tablet (5 mg total) by mouth daily. (Patient not taking: Reported on 10/31/2022) 90 tablet 3   No facility-administered medications prior to visit.    No Known Allergies  Social History   Socioeconomic History   Marital status: Married    Spouse name: Not on file   Number of children: Not on file   Years of education: Not on file   Highest education  level: Not on file  Occupational History   Occupation: Risk manager: [employer]  Tobacco Use   Smoking status: Never    Passive exposure: Never   Smokeless tobacco: Never  Vaping Use   Vaping status: Never Used  Substance and Sexual Activity   Alcohol use: No   Drug use: No   Sexual activity: Not Currently    Partners: Male    Birth control/protection: Post-menopausal  Other Topics Concern   Not on file  Social History Narrative   ** Merged History Encounter **       Married, 2 children, 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, 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, housekeeping, 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   Social Determinants of Health   Financial Resource Strain: Not on file  Food Insecurity: No Food Insecurity (08/30/2021)   Received from [facility], [facility]   Hunger Vital Sign    Worried About Running Out of Food in the Last Year: Never true    Ran Out of Food in the Last Year: Never true  Transportation Needs: Not on file  Physical Activity: Not on file  Stress: Not on file  Social Connections: Unknown (07/06/2021)   Received from [facility], [facility]   Social Network    Social Network: Not on file         Objective:  Physical Exam: BP 120/80 (BP Location: Left Arm, Patient Position: Sitting, Cuff Size: Normal)   Pulse 86   Temp 98 F (36.7 C)   Resp 16   Ht 5\' 1"  (1.549 m)   Wt 169 lb 6.4 oz (76.8 kg)   LMP 03/04/2006   SpO2 96%   BMI 32.01 kg/m   Body mass index is 32.01 kg/m. Wt Readings from Last 3 Encounters:  10/31/22 169 lb 6.4 oz (76.8 kg)  04/09/22 170 lb 3.2 oz (77.2 kg)  05/02/19 155 lb (70.3 kg)    Physical Exam Constitutional:      General: She is not in acute distress.    Appearance: Normal appearance. She is not ill-appearing or toxic-appearing.  HENT:     Head: Normocephalic and atraumatic.     Right Ear: Hearing, tympanic membrane, ear canal and external ear normal. There is no impacted cerumen.     Left Ear: Hearing, tympanic membrane, ear canal and external ear normal. There is no impacted cerumen.     Nose: Nose normal. No congestion.     Mouth/Throat:     Lips: No lesions.     Mouth: Mucous membranes are moist.     Pharynx: Oropharynx is clear. No oropharyngeal exudate.  Eyes:     General: No scleral icterus.       Right eye: No discharge.        Left eye: No discharge.     Conjunctiva/sclera: Conjunctivae normal.     Pupils: Pupils are equal, round, and reactive to light.  Neck:     Thyroid: No thyroid mass, thyromegaly or thyroid tenderness.  Cardiovascular:     Rate and Rhythm: Normal rate and regular rhythm.     Pulses: Normal pulses.     Heart sounds: Normal heart sounds.  Pulmonary:     Effort: Pulmonary effort is normal. No respiratory distress.     Breath sounds: Normal breath sounds.  Abdominal:     General: Abdomen is flat. Bowel sounds are normal.     Palpations: Abdomen is soft.  Musculoskeletal:        General: Normal range of motion.  Cervical back: Normal range of motion.     Right lower leg: No edema.     Left lower leg: No edema.  Lymphadenopathy:     Cervical: No cervical adenopathy.  Skin:    General: Skin is warm  and dry.     Findings: No rash.  Neurological:     General: No focal deficit present.     Mental Status: She is alert and oriented to person, place, and time. Mental status is at baseline.     Cranial Nerves: Cranial nerves 2-12 are intact.     Sensory: Sensation is intact.     Motor: Motor function is intact.     Coordination: Coordination is intact.     Deep Tendon Reflexes:     Reflex Scores:      Patellar reflexes are 2+ on the right side and 2+ on the left side. Psychiatric:        Mood and Affect: Mood normal.        Behavior: Behavior normal.        Thought Content: Thought content normal.        Judgment: Judgment normal.    EKG with normal sinus rhythm     Orthostatic VS for the past 72 hrs (Last 3 readings):  Orthostatic BP Patient Position BP Location Cuff Size Orthostatic Pulse  10/31/22 1352 137/83 Standing Left Arm Normal 93  10/31/22 1351 125/78 Sitting Left Arm Normal 88  10/31/22 1350 132/86 Supine Left Arm Normal 77   Dizziness with orthostatic standing up  At today's visit, we discussed treatment options, associated risk and benefits, and engage in counseling as needed.  Additionally the following were reviewed: Past medical records, past medical and surgical history, family and social background, as well as relevant laboratory results, imaging findings, and specialty notes, where applicable.  This message was generated using dictation software, and as a result, it may contain unintentional typos or errors.  Nevertheless, extensive effort was made to accurately convey at the pertinent aspects of the patient visit.    There may have been are other unrelated non-urgent complaints, but due to the busy schedule and the amount of time already spent with her, time does not permit to address these issues at today's visit. Another appointment may have or has been requested to review these additional issues.   Thomes Dinning, MD, MS

## 2022-11-01 ENCOUNTER — Telehealth: Payer: Self-pay

## 2022-11-01 ENCOUNTER — Other Ambulatory Visit: Payer: Self-pay

## 2022-11-01 DIAGNOSIS — E785 Hyperlipidemia, unspecified: Secondary | ICD-10-CM

## 2022-11-01 LAB — LIPID PANEL
Cholesterol: 188 mg/dL (ref 0–200)
HDL: 36.7 mg/dL — ABNORMAL LOW (ref >39.00)
NonHDL: 151.74
Total CHOL/HDL Ratio: 5
Triglycerides: 264 mg/dL — ABNORMAL HIGH (ref 0.0–149.0)
VLDL: 52.8 mg/dL — ABNORMAL HIGH (ref 0.0–40.0)

## 2022-11-01 LAB — CBC WITH DIFFERENTIAL/PLATELET
Basophils Absolute: 0.2 10*3/uL — ABNORMAL HIGH (ref 0.0–0.1)
Basophils Relative: 2.1 % (ref 0.0–3.0)
Eosinophils Absolute: 0.4 10*3/uL (ref 0.0–0.7)
Eosinophils Relative: 4 % (ref 0.0–5.0)
HCT: 42.8 % (ref 36.0–46.0)
Hemoglobin: 13.9 g/dL (ref 12.0–15.0)
Lymphocytes Relative: 39.3 % (ref 12.0–46.0)
Lymphs Abs: 3.6 10*3/uL (ref 0.7–4.0)
MCHC: 32.4 g/dL (ref 30.0–36.0)
MCV: 86.6 fl (ref 78.0–100.0)
Monocytes Absolute: 0.4 10*3/uL (ref 0.1–1.0)
Monocytes Relative: 4.8 % (ref 3.0–12.0)
Neutro Abs: 4.6 10*3/uL (ref 1.4–7.7)
Neutrophils Relative %: 49.8 % (ref 43.0–77.0)
Platelets: 309 10*3/uL (ref 150.0–400.0)
RBC: 4.95 Mil/uL (ref 3.87–5.11)
RDW: 15.1 % (ref 11.5–15.5)
WBC: 9.2 10*3/uL (ref 4.0–10.5)

## 2022-11-01 LAB — TSH: TSH: 0.97 u[IU]/mL (ref 0.35–5.50)

## 2022-11-01 LAB — COMPREHENSIVE METABOLIC PANEL
ALT: 20 U/L (ref 0–35)
AST: 16 U/L (ref 0–37)
Albumin: 4.2 g/dL (ref 3.5–5.2)
Alkaline Phosphatase: 70 U/L (ref 39–117)
BUN: 11 mg/dL (ref 6–23)
CO2: 27 meq/L (ref 19–32)
Calcium: 9.5 mg/dL (ref 8.4–10.5)
Chloride: 100 meq/L (ref 96–112)
Creatinine, Ser: 0.67 mg/dL (ref 0.40–1.20)
GFR: 92.45 mL/min (ref >60.00)
Glucose, Bld: 89 mg/dL (ref 70–99)
Potassium: 4.1 meq/L (ref 3.5–5.1)
Sodium: 138 meq/L (ref 135–145)
Total Bilirubin: 0.4 mg/dL (ref 0.2–1.2)
Total Protein: 7.9 g/dL (ref 6.0–8.3)

## 2022-11-01 LAB — HCV AB W REFLEX TO QUANT PCR: HCV Ab: NONREACTIVE

## 2022-11-01 LAB — B12 AND FOLATE PANEL
Folate: 16.6 ng/mL (ref >5.9)
Vitamin B-12: 311 pg/mL (ref 211–911)

## 2022-11-01 LAB — HCV INTERPRETATION

## 2022-11-01 LAB — HEMOGLOBIN A1C: Hgb A1c MFr Bld: 6 % (ref 4.6–6.5)

## 2022-11-01 LAB — LDL CHOLESTEROL, DIRECT: Direct LDL: 134 mg/dL

## 2022-11-01 MED ORDER — ROSUVASTATIN CALCIUM 5 MG PO TABS: 5.0000 mg | ORAL_TABLET | Freq: Every day | ORAL | 3 refills | Status: AC

## 2022-11-01 NOTE — Telephone Encounter (Signed)
Results and recommendations given to pt and husband. Rosuvastatin sent to the pharmacy

## 2022-11-04 LAB — URINE CULTURE
MICRO NUMBER:: 15401935
SPECIMEN QUALITY:: ADEQUATE

## 2022-11-04 LAB — URINALYSIS W MICROSCOPIC + REFLEX CULTURE
Bacteria, UA: NONE SEEN /HPF
Bilirubin Urine: NEGATIVE
Glucose, UA: NEGATIVE
Hgb urine dipstick: NEGATIVE
Hyaline Cast: NONE SEEN /LPF
Ketones, ur: NEGATIVE
Nitrites, Initial: NEGATIVE
Protein, ur: NEGATIVE
RBC / HPF: NONE SEEN /HPF (ref 0–2)
Specific Gravity, Urine: 1.013 (ref 1.001–1.035)
Squamous Epithelial / HPF: NONE SEEN /HPF (ref <=5)
pH: 5.5 (ref 5.0–8.0)

## 2022-11-04 LAB — VITAMIN D 1,25 DIHYDROXY
Vitamin D 1, 25 (OH)2 Total: 53 pg/mL (ref 18–72)
Vitamin D2 1, 25 (OH)2: <8 pg/mL
Vitamin D3 1, 25 (OH)2: 53 pg/mL

## 2022-11-04 LAB — HIV ANTIBODY (ROUTINE TESTING W REFLEX): HIV 1&2 Ab, 4th Generation: NONREACTIVE

## 2022-11-04 LAB — CULTURE INDICATED

## 2022-11-17 ENCOUNTER — Encounter (HOSPITAL_COMMUNITY): Payer: Self-pay

## 2022-11-17 ENCOUNTER — Other Ambulatory Visit: Payer: Self-pay

## 2022-11-17 ENCOUNTER — Emergency Department (HOSPITAL_COMMUNITY)
Admission: EM | Admit: 2022-11-17 | Discharge: 2022-11-17 | Discharge status: Against Medical Advice | Payer: 59 | Attending: Emergency Medicine | Admitting: Emergency Medicine

## 2022-11-17 DIAGNOSIS — M79642 Pain in left hand: Secondary | ICD-10-CM | POA: Insufficient documentation

## 2022-11-17 DIAGNOSIS — Z5321 Procedure and treatment not carried out due to patient leaving prior to being seen by health care provider: Secondary | ICD-10-CM | POA: Insufficient documentation

## 2022-11-17 NOTE — ED Triage Notes (Addendum)
Sent by [facility] for hand pain. Pt states left hand has been hurting for a week and a half, Pain is in center of hand and sometimes radiates up arm. Intermittent itching. Grips and strength are equal in bilateral hands

## 2022-11-17 NOTE — ED Notes (Signed)
Pt not in room, pt phone called to see if pt left. NO answer. Pt eloped

## 2023-06-06 ENCOUNTER — Telehealth: Payer: Self-pay

## 2023-06-06 NOTE — Telephone Encounter (Signed)
 Called patient with a [place] interpretor to schedule a new patient appointment per a referral from Fanny Bien, MD.   Left voicemail for patient to call back and schedule an appointment.

## 2023-07-11 ENCOUNTER — Ambulatory Visit
Admission: RE | Admit: 2023-07-11 | Discharge: 2023-07-11 | Disposition: A | Discharge status: Home / Self Care | Payer: Self-pay | Source: Ambulatory Visit | Attending: Family Medicine | Admitting: Family Medicine

## 2023-07-11 DIAGNOSIS — Z0000 Encounter for general adult medical examination without abnormal findings: Secondary | ICD-10-CM

## 2023-07-23 ENCOUNTER — Other Ambulatory Visit: Payer: Self-pay

## 2023-07-23 ENCOUNTER — Emergency Department (HOSPITAL_COMMUNITY): Payer: PRIVATE HEALTH INSURANCE

## 2023-07-23 ENCOUNTER — Ambulatory Visit (INDEPENDENT_AMBULATORY_CARE_PROVIDER_SITE_OTHER): Payer: PRIVATE HEALTH INSURANCE | Admitting: Physician Assistant

## 2023-07-23 ENCOUNTER — Encounter: Payer: Self-pay | Admitting: Physician Assistant

## 2023-07-23 ENCOUNTER — Encounter (HOSPITAL_COMMUNITY): Payer: Self-pay | Admitting: *Deleted

## 2023-07-23 ENCOUNTER — Emergency Department (HOSPITAL_COMMUNITY)
Admission: EM | Admit: 2023-07-23 | Discharge: 2023-07-23 | Disposition: A | Discharge status: Home / Self Care | Payer: PRIVATE HEALTH INSURANCE | Attending: Emergency Medicine | Admitting: Emergency Medicine

## 2023-07-23 VITALS — BP 125/83 | Ht 61.0 in | Wt 169.6 lb

## 2023-07-23 DIAGNOSIS — Z7689 Persons encountering health services in other specified circumstances: Secondary | ICD-10-CM

## 2023-07-23 DIAGNOSIS — R1084 Generalized abdominal pain: Secondary | ICD-10-CM | POA: Diagnosis not present

## 2023-07-23 DIAGNOSIS — E782 Mixed hyperlipidemia: Secondary | ICD-10-CM | POA: Diagnosis not present

## 2023-07-23 DIAGNOSIS — R109 Unspecified abdominal pain: Secondary | ICD-10-CM | POA: Diagnosis present

## 2023-07-23 LAB — I-STAT CHEM 8, ED
BUN: 13 mg/dL (ref 8–23)
Calcium, Ion: 1.17 mmol/L (ref 1.15–1.40)
Chloride: 104 mmol/L (ref 98–111)
Creatinine, Ser: 0.7 mg/dL (ref 0.44–1.00)
Glucose, Bld: 92 mg/dL (ref 70–99)
HCT: 39 % (ref 36.0–46.0)
Hemoglobin: 13.3 g/dL (ref 12.0–15.0)
Potassium: 4.3 mmol/L (ref 3.5–5.1)
Sodium: 141 mmol/L (ref 135–145)
TCO2: 24 mmol/L (ref 22–32)

## 2023-07-23 LAB — CBC WITH DIFFERENTIAL/PLATELET
Abs Immature Granulocytes: 0.01 10*3/uL (ref 0.00–0.07)
Basophils Absolute: 0 10*3/uL (ref 0.0–0.1)
Basophils Relative: 0 %
Eosinophils Absolute: 0.3 10*3/uL (ref 0.0–0.5)
Eosinophils Relative: 4 %
HCT: 37.9 % (ref 36.0–46.0)
Hemoglobin: 12.9 g/dL (ref 12.0–15.0)
Immature Granulocytes: 0 %
Lymphocytes Relative: 35 %
Lymphs Abs: 2.7 10*3/uL (ref 0.7–4.0)
MCH: 29.2 pg (ref 26.0–34.0)
MCHC: 34 g/dL (ref 30.0–36.0)
MCV: 85.7 fL (ref 80.0–100.0)
Monocytes Absolute: 0.4 10*3/uL (ref 0.1–1.0)
Monocytes Relative: 6 %
Neutro Abs: 4.2 10*3/uL (ref 1.7–7.7)
Neutrophils Relative %: 55 %
Platelets: 266 10*3/uL (ref 150–400)
RBC: 4.42 MIL/uL (ref 3.87–5.11)
RDW: 14.5 % (ref 11.5–15.5)
WBC: 7.6 10*3/uL (ref 4.0–10.5)
nRBC: 0 % (ref 0.0–0.2)

## 2023-07-23 LAB — COMPREHENSIVE METABOLIC PANEL WITH GFR
ALT: 18 U/L (ref 0–44)
AST: 18 U/L (ref 15–41)
Albumin: 3.5 g/dL (ref 3.5–5.0)
Alkaline Phosphatase: 61 U/L (ref 38–126)
Anion gap: 7 (ref 5–15)
BUN: 13 mg/dL (ref 8–23)
CO2: 24 mmol/L (ref 22–32)
Calcium: 8.7 mg/dL — ABNORMAL LOW (ref 8.9–10.3)
Chloride: 103 mmol/L (ref 98–111)
Creatinine, Ser: 0.61 mg/dL (ref 0.44–1.00)
GFR, Estimated: >60 mL/min (ref >60)
Glucose, Bld: 94 mg/dL (ref 70–99)
Potassium: 4.1 mmol/L (ref 3.5–5.1)
Sodium: 134 mmol/L — ABNORMAL LOW (ref 135–145)
Total Bilirubin: 0.4 mg/dL (ref 0.0–1.2)
Total Protein: 7.6 g/dL (ref 6.5–8.1)

## 2023-07-23 LAB — URINALYSIS, ROUTINE W REFLEX MICROSCOPIC
Bacteria, UA: NONE SEEN
Bilirubin Urine: NEGATIVE
Glucose, UA: NEGATIVE mg/dL
Hgb urine dipstick: NEGATIVE
Ketones, ur: NEGATIVE mg/dL
Nitrite: NEGATIVE
Protein, ur: NEGATIVE mg/dL
Specific Gravity, Urine: 1.011 (ref 1.005–1.030)
pH: 7 (ref 5.0–8.0)

## 2023-07-23 LAB — LIPASE, BLOOD: Lipase: 30 U/L (ref 11–51)

## 2023-07-23 MED ORDER — PANTOPRAZOLE SODIUM 40 MG PO TBEC: 40.0000 mg | DELAYED_RELEASE_TABLET | Freq: Every day | ORAL | 0 refills | Status: AC

## 2023-07-23 MED ORDER — KETOROLAC TROMETHAMINE 15 MG/ML IJ SOLN
15.0000 mg | Freq: Once | INTRAMUSCULAR | Status: AC
  Administered 2023-07-23: 15 mg via INTRAVENOUS
  Filled 2023-07-23: qty 1

## 2023-07-23 MED ORDER — IOHEXOL 300 MG/ML  SOLN
100.0000 mL | Freq: Once | INTRAMUSCULAR | Status: AC | PRN
  Administered 2023-07-23: 100 mL via INTRAVENOUS

## 2023-07-23 MED ORDER — SODIUM CHLORIDE 0.9 % IV BOLUS
1000.0000 mL | Freq: Once | INTRAVENOUS | Status: AC
  Administered 2023-07-23: 1000 mL via INTRAVENOUS

## 2023-07-23 MED ORDER — DICYCLOMINE HCL 20 MG PO TABS
20.0000 mg | ORAL_TABLET | Freq: Two times a day (BID) | ORAL | 0 refills | Status: AC
Start: 2023-07-23 — End: ?

## 2023-07-23 NOTE — ED Triage Notes (Signed)
 Pt c/o abdominal pain that woke her from sleep this am at 2; pt denies any n/v/d and states her last BM was normal

## 2023-07-23 NOTE — Discharge Instructions (Addendum)
 It was a pleasure caring for you today in the emergency department.  The etiology of your abdominal pain is unclear.   Recommend you follow up with gastroenterology, I have given referral to GI here locally since you recently moved to the area. You are also welcome to see you GI in [place] if that is preferable   There are many causes of abdominal pain. Most pain is not serious and goes away, but some pain gets worse, changes, or will not go away. Please return to the emergency department or see your doctor right away if you (or your family member) experience any of the following:  1. Pain that gets worse or moves to just one spot.  2. Pain that gets worse if you cough or sneeze.  3. Pain with going over a bump in the road.  4. Pain that does not get better in 24 hours.  5. Inability to keep down liquids (vomiting)-especially if you are making less urine.  6. Fainting.  7. Blood in the vomit or stool.  8. High fever or shaking chills.  9. Swelling of the abdomen.  10. Any new or worsening problem.     Additional Instructions  No alcohol.  No caffeine, aspirin , or cigarettes.   Please return to the emergency department immediately for any new or concerning symptoms, or if you get worse.     R?t vui ???c ch?m [place] b?n t?i khoa c?p c?u hm nay.  Nguyn nhn gy ?au b?ng c?a b?n v?n ch?a r rng.  Khuy?n co b?n nn theo di v?i bc s? chuyn khoa tiu ha, ti ? gi?i thi?u b?n ??n bc s? chuyn khoa tiu ha t?i ?y k? t? khi b?n m?i chuy?n ??n khu v?c ny. B?n c?ng c th? ??n g?p bc s? chuyn khoa tiu ha t?i [place] n?u mu?n  C nhi?u nguyn nhn gy ?au b?ng. H?u h?t cc c?n ?au khng nghim tr?ng v s? t? kh?i, nh?ng m?t s? c?n ?au tr? nn t?i t? h?n, thay ??i ho?c khng kh?i. Vui lng quay l?i khoa c?p c?u ho?c g?p bc s? ngay n?u b?n (ho?c thnh vin gia ?nh) g?p b?t k? tri?u ch?ng no sau ?y: 1. ?au tr? nn t?i t? h?n ho?c ch? di chuy?n ??n m?t ch?. 2. ?au tr? nn t?i  t? h?n khi b?n ho ho?c h?t h?i. 3. ?au khi ?i qua ? g trn ???ng. 4. ?au khng thuyn gi?m trong vng 24 gi?. 5. Khng th? gi? n??c (nn m?a) - ??c bi?t n?u b?n ?i ti?u t h?n. 6. Ng?t x?u. 7. C mu trong ch?t nn ho?c phn.  8. S?t cao ho?c ?n l?nh run r?y.  9. S?ng b?ng.  10. B?t k? v?n ?? m?i ho?c tr? nn t?i t? h?n.   H??ng d?n b? sung Khng u?ng r??u.  Khng dng caffeine, aspirin  ho?c thu?c l.  Vui lng quay l?i khoa c?p c?u ngay l?p t?c n?u c b?t k? tri?u ch?ng m?i ho?c ?ng lo ng?i no ho?c n?u b?n tr? nn t?i t? h?n.

## 2023-07-23 NOTE — ED Provider Notes (Signed)
 [facility] AT [facility] Provider Note  CSN: [id number] Arrival date & time: 07/23/23 9147  Chief Complaint(s) Abdominal Pain  HPI Michelle Barr is a 63 y.o. female with past medical history as below, significant for chronic abd pain, HLD, anemia who presents to the ED with complaint of abd pain.   Patient reports chronic intermittent abdominal pain.  She began to experience periumbilical abdominal pain described as a twisting, squeezing sensation around 2 AM this morning.  She used a warm compress and the pain has since greatly improved.  No nausea or vomiting.  No change in bowel or bladder function, no fevers or chills.  No change to p.o. intake.  Reports similar pain in the past but this morning felt worse.   Past Medical History Past Medical History:  Diagnosis Date   Anemia 10 years ago   no medication   Chronic abdominal pain    Encounter for routine gynecological examination    sees gynecology   Hyperlipidemia    Impaired fasting blood sugar    Shortness of breath    Patient Active Problem List   Diagnosis Date Noted   Orthostatic hypotension 10/31/2022   Dysphagia 04/09/2022   Hyperlipidemia 04/09/2022   Pain of right heel 04/09/2022   Gastroesophageal reflux disease without esophagitis 01/11/2021   Annual physical exam 01/11/2015   Generalized abdominal pain 01/11/2015   Impaired fasting glucose 01/11/2015   Special screening for malignant neoplasms, colon 01/11/2015   Constipation 01/11/2015   Home Medication(s) Prior to Admission medications   Medication Sig Start Date End Date Taking? Authorizing Provider  acetaminophen  (TYLENOL  8 HOUR) 650 MG CR tablet Take 1 tablet (650 mg total) by mouth every 8 (eight) hours as needed for pain. 04/09/22   Catheryn Cluck, MD  diclofenac  Sodium (VOLTAREN ) 1 % GEL Apply 4 g topically 4 (four) times daily as needed. 04/09/22   Catheryn Cluck, MD  rosuvastatin  (CRESTOR ) 5 MG tablet Take 1  tablet (5 mg total) by mouth daily. 11/01/22   Catheryn Cluck, MD                                                                                                                                    Past Surgical History Past Surgical History:  Procedure Laterality Date   NO PAST SURGERIES  01/2015   Family History Family History  Problem Relation Age of Onset   Colon cancer Neg Hx    Stomach cancer Neg Hx    Breast cancer Neg Hx     Social History Social History   Tobacco Use   Smoking status: Never    Passive exposure: Never   Smokeless tobacco: Never  Vaping Use   Vaping status: Never Used  Substance Use Topics   Alcohol use: No   Drug use: No   Allergies Patient has no known allergies.  Review of Systems  A thorough review of systems was obtained and all systems are negative except as noted in the HPI and PMH.   Physical Exam Vital Signs  I have reviewed the triage vital signs BP 134/82   Pulse 72   Temp 97.8 F (36.6 C) (Oral)   Resp 16   Ht 5\' 1"  (1.549 m)   Wt 75.8 kg   LMP 03/04/2006   SpO2 97%   BMI 31.55 kg/m  Physical Exam Vitals and nursing note reviewed.  Constitutional:      General: She is not in acute distress.    Appearance: Normal appearance. She is well-developed. She is obese.  HENT:     Head: Normocephalic and atraumatic.     Right Ear: External ear normal.     Left Ear: External ear normal.     Nose: Nose normal.     Mouth/Throat:     Mouth: Mucous membranes are moist.  Eyes:     General: No scleral icterus.       Right eye: No discharge.        Left eye: No discharge.  Cardiovascular:     Rate and Rhythm: Normal rate and regular rhythm.     Pulses: Normal pulses.     Heart sounds: Normal heart sounds.  Pulmonary:     Effort: Pulmonary effort is normal. No respiratory distress.     Breath sounds: Normal breath sounds. No stridor.  Abdominal:     General: Abdomen is flat. There is no distension.     Palpations: Abdomen is  soft.     Tenderness: There is abdominal tenderness in the periumbilical area. There is no guarding. Negative signs include Murphy's sign and McBurney's sign.  Musculoskeletal:     Cervical back: No rigidity.     Right lower leg: No edema.     Left lower leg: No edema.  Skin:    General: Skin is warm and dry.     Capillary Refill: Capillary refill takes less than 2 seconds.  Neurological:     Mental Status: She is alert.  Psychiatric:        Mood and Affect: Mood normal.        Behavior: Behavior normal. Behavior is cooperative.     ED Results and Treatments Labs (all labs ordered are listed, but only abnormal results are displayed) Labs Reviewed  COMPREHENSIVE METABOLIC PANEL WITH GFR - Abnormal; Notable for the following components:      Result Value   Sodium 134 (*)    Calcium  8.7 (*)    All other components within normal limits  URINALYSIS, ROUTINE W REFLEX MICROSCOPIC - Abnormal; Notable for the following components:   Color, Urine STRAW (*)    Leukocytes,Ua TRACE (*)    All other components within normal limits  CBC WITH DIFFERENTIAL/PLATELET  LIPASE, BLOOD  I-STAT CHEM 8, ED  Radiology CT ABDOMEN PELVIS W CONTRAST Result Date: 07/23/2023 CLINICAL DATA:  Left upper quadrant pain EXAM: CT ABDOMEN AND PELVIS WITH CONTRAST TECHNIQUE: Multidetector CT imaging of the abdomen and pelvis was performed using the standard protocol following bolus administration of intravenous contrast. RADIATION DOSE REDUCTION: This exam was performed according to the departmental dose-optimization program which includes automated exposure control, adjustment of the mA and/or kV according to patient size and/or use of iterative reconstruction technique. CONTRAST:  OMNIPAQUE  IOHEXOL  300 MG/ML  SOLN COMPARISON:  CTA chest 03/20/2014 FINDINGS: Lower chest: No pleural or  pericardial effusion. Minimal dependent atelectasis in the lung bases. Hepatobiliary: No focal liver abnormality is seen. No gallstones, gallbladder wall thickening, or biliary dilatation. Pancreas: Unremarkable. No pancreatic ductal dilatation or surrounding inflammatory changes. Spleen: Normal in size without focal abnormality. Adrenals/Urinary Tract: Adrenal glands are unremarkable. Kidneys are normal, without renal calculi, focal lesion, or hydronephrosis. Bladder is unremarkable. Stomach/Bowel: Stomach is nondistended. Small bowel decompressed. Normal appendix. The colon is partially distended without acute finding. Vascular/Lymphatic: No significant vascular findings are present. No enlarged abdominal or pelvic lymph nodes. Reproductive: Uterus and bilateral adnexa are unremarkable. Other: No ascites.  No free air. 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 IMPRESSION: No acute findings. Electronically Signed   By: Nicoletta Barrier M.D.   On: 07/23/2023 10:18    Pertinent labs & imaging results that were available during my care of the patient were reviewed by me and considered in my medical decision making (see MDM for details).  Medications Ordered in ED Medications  sodium chloride  0.9 % bolus 1,000 mL (0 mLs Intravenous Stopped 07/23/23 1048)  ketorolac (TORADOL) 15 MG/ML injection 15 mg (15 mg Intravenous Given 07/23/23 0816)  iohexol  (OMNIPAQUE ) 300 MG/ML solution 100 mL (100 mLs Intravenous Contrast Given 07/23/23 0859)                                                                                                                                     Procedures Procedures  (including critical care time)  Medical Decision Making / ED Course    Medical Decision Making:    Durinda Thi Ngoc Gabay is a 63 y.o. female with past medical history as below, significant for chronic abd pain, HLD, anemia who presents to the ED with complaint of abd pain. . The complaint involves an extensive  differential diagnosis and also carries with it a high risk of complications and morbidity.  Serious etiology was considered. Ddx includes but is not limited to: Differential diagnosis includes but is not exclusive to acute cholecystitis, intrathoracic causes for epigastric abdominal pain, gastritis, duodenitis, pancreatitis, small bowel or large bowel obstruction, abdominal aortic aneurysm, hernia, gastritis, fibroid, UTI, pyelonephritis, nephrolithiasis etc.     Complete initial physical exam performed, notably the patient was in no distress, resting comfortably in stretcher.    Reviewed and confirmed nursing documentation for past medical history, family history,  social history.  Vital signs reviewed.     Brief summary:  63 year old female history as above here with periumbilical abdominal pain has since greatly improved since the onset.  Not a/w nausea or vomiting, no change in bowel or bladder function.   Clinical Course as of 07/23/23 1147  Wed Jul 23, 2023  0803 Interpreter #[id number] [SG]  1038 CT non-acute [SG]  1049 Symptoms resolved [SG]    Clinical Course User Index [SG] Teddi Favors, DO    Labs reviewed, these are stable.  Imaging is stable.  Her symptoms are resolved, she is tolerant p.o. intake without difficulty.  Pt does have report of intermittent chronic abdominal pain for which she saw GI previously, workup at that time was stable including endo/colonoscopy. Symptoms today may be acute flair of her chronic abd pain. Will start back on bentyl and ppi, have her f/u with GI and pcp. Abd precautions provided  The patient's overall condition has improved, the patient presents with abdominal pain without signs of peritonitis, or other life-threatening serious etiology. The patient understands that at this time there is no evidence for a more malignant underlying process, but the patient also understands that early in the process of an illness, an emergency department workup  can be falsely reassuring. Detailed discussions were had with the patient regarding current findings, and need for close f/u with PCP or on call doctor. The patient appears stable for discharge and has been instructed to return immediately if the symptoms worsen in any way, or 12hr if not improved for re-evaluation. Patient verbalized understanding and is in agreement with current care plan.  All questions answered prior to discharge.            Additional history obtained: -Additional history obtained from spouse -External records from outside source obtained and reviewed including: Chart review including previous notes, labs, imaging, consultation notes including  Prior ER evaluation, prior gastroenterology evaluation Dr. Elvin Hammer 2017 For endoscopy and colonoscopy 2017 with Dr. Elvin Hammer with single polyp and underwent polypectomy with cold snare.  Upper endoscopy was normal, concern for GERD   Lab Tests: -I ordered, reviewed, and interpreted labs.   The pertinent results include:   Labs Reviewed  COMPREHENSIVE METABOLIC PANEL WITH GFR - Abnormal; Notable for the following components:      Result Value   Sodium 134 (*)    Calcium  8.7 (*)    All other components within normal limits  URINALYSIS, ROUTINE W REFLEX MICROSCOPIC - Abnormal; Notable for the following components:   Color, Urine STRAW (*)    Leukocytes,Ua TRACE (*)    All other components within normal limits  CBC WITH DIFFERENTIAL/PLATELET  LIPASE, BLOOD  I-STAT CHEM 8, ED    Notable for labs stable  EKG   EKG Interpretation Date/Time:    Ventricular Rate:    PR Interval:    QRS Duration:    QT Interval:    QTC Calculation:   R Axis:      Text Interpretation:           Imaging Studies ordered: I ordered imaging studies including CTAP I independently visualized the following imaging with scope of interpretation limited to determining acute life threatening conditions related to emergency care; findings  noted above I agree with the radiologist interpretation If any imaging was obtained with contrast I closely monitored patient for any possible adverse reaction a/w contrast administration in the emergency department   Medicines ordered and prescription drug management: Meds ordered this encounter  Medications   sodium chloride  0.9 % bolus 1,000 mL   ketorolac (TORADOL) 15 MG/ML injection 15 mg   iohexol  (OMNIPAQUE ) 300 MG/ML solution 100 mL    -I have reviewed the patients home medicines and have made adjustments as needed   Consultations Obtained: na   Cardiac Monitoring: Continuous pulse oximetry interpreted by myself, 99% on RA.    Social Determinants of Health:  Diagnosis or treatment significantly limited by social determinants of health: obesity, poor english language (speaks vietnamese)   Reevaluation: After the interventions noted above, I reevaluated the patient and found that they have improved  Co morbidities that complicate the patient evaluation  Past Medical History:  Diagnosis Date   Anemia 10 years ago   no medication   Chronic abdominal pain    Encounter for routine gynecological examination    sees gynecology   Hyperlipidemia    Impaired fasting blood sugar    Shortness of breath       Dispostion: Disposition decision including need for hospitalization was considered, and patient discharged from emergency department.    Final Clinical Impression(s) / ED Diagnoses Final diagnoses:  Generalized abdominal pain        Teddi Favors, DO 07/23/23 1147

## 2023-07-23 NOTE — Assessment & Plan Note (Signed)
 Symptoms have resolved at this time. Normal abdominal exam. Patient advised to start new medication. She is to follow up in 2-4 weeks if symptoms persist.

## 2023-07-23 NOTE — Progress Notes (Signed)
 New Patient Office Visit  Subjective    Patient ID: Michelle Barr, female    DOB: 12-10-1960  Age: 63 y.o. MRN: [id number]  CC:  Chief Complaint  Patient presents with   Establish Care    Was in ER this morning an afternoon with abdominal pain    HPI Sameul Barr Christine presents to establish care  Patient presents today with past medical history significant for orthostatic hypotension and hyperlipidemia. She reports previously being prescribed a statin for cholesterol but never took medication. She reports headaches approximately 1 time per week that resolve with tylenol . Husband reports intermittent dizziness when patient gets up from bed or stands from a chair, he endorses history of orthostatic hypotension. Patient was seen in the ER this morning for abdominal pain, normal workup at that time. Patient started on acid medication and bentyl for symptoms. She has no further complaints today.   Outpatient Encounter Medications as of 07/23/2023  Medication Sig   acetaminophen  (TYLENOL  8 HOUR) 650 MG CR tablet Take 1 tablet (650 mg total) by mouth every 8 (eight) hours as needed for pain.   dicyclomine (BENTYL) 20 MG tablet Take 1 tablet (20 mg total) by mouth 2 (two) times daily.   pantoprazole  (PROTONIX ) 40 MG tablet Take 1 tablet (40 mg total) by mouth daily for 14 days.   rosuvastatin  (CRESTOR ) 5 MG tablet Take 1 tablet (5 mg total) by mouth daily. (Patient not taking: Reported on 07/23/2023)   [DISCONTINUED] diclofenac  Sodium (VOLTAREN ) 1 % GEL Apply 4 g topically 4 (four) times daily as needed.   No facility-administered encounter medications on file as of 07/23/2023.    Past Medical History:  Diagnosis Date   Anemia 10 years ago   no medication   Chronic abdominal pain    Encounter for routine gynecological examination    sees gynecology   Hyperlipidemia    Impaired fasting blood sugar    Shortness of breath     Past Surgical History:  Procedure Laterality Date   NO  PAST SURGERIES  01/2015    Family History  Problem Relation Age of Onset   Colon cancer Neg Hx    Stomach cancer Neg Hx    Breast cancer Neg Hx     Social History   Socioeconomic History   Marital status: Married    Spouse name: Not on file   Number of children: Not on file   Years of education: Not on file   Highest education level: Not on file  Occupational History   Occupation: Programmer, applications    Employer: [employer]  Tobacco Use   Smoking status: Never    Passive exposure: Never   Smokeless tobacco: Never  Vaping Use   Vaping status: Never Used  Substance and Sexual Activity   Alcohol use: No   Drug use: No   Sexual activity: Not Currently    Partners: Male    Birth control/protection: Post-menopausal  Other Topics Concern   Not on file  Social History Narrative   ** Merged History Encounter **       Married, 2 children, 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, 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, housekeeping, 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   Social Drivers of Corporate investment banker Strain: Not on file  Food Insecurity: No Food Insecurity (08/30/2021)   Received from [facility], [facility]   Hunger Vital Sign    Worried About Running Out of Food in the Last Year: Never true    Ran Out of Food in  the Last Year: Never true  Transportation Needs: Not on file  Physical Activity: Not on file  Stress: Not on file  Social Connections: Unknown (07/06/2021)   Received from [facility], [facility]   Social Network    Social Network: Not on file  Intimate Partner Violence: Unknown (06/04/2021)   Received from [facility], [facility]   HITS    Physically Hurt: Not on file    Insult or Talk Down To: Not on file    Threaten Physical Harm: Not on file    Scream or Curse: Not on file    Review of Systems  Constitutional:  Negative for chills, fever and malaise/fatigue.  Eyes:  Negative for blurred vision and double vision.  Respiratory:  Negative for cough and shortness of breath.   Cardiovascular:  Negative for  chest pain and palpitations.  Musculoskeletal:  Negative for joint pain and myalgias.  Neurological:  Positive for headaches. Negative for dizziness.  Psychiatric/Behavioral:  Negative for depression. The patient is not nervous/anxious.         Objective    BP 125/83   Ht 5\' 1"  (1.549 m)   Wt 169 lb 9.6 oz (76.9 kg)   LMP 03/04/2006   BMI 32.05 kg/m   Physical Exam Constitutional:      Appearance: Normal appearance.  HENT:     Head: Normocephalic.     Mouth/Throat:     Mouth: Mucous membranes are moist.     Pharynx: Oropharynx is clear.  Eyes:     Extraocular Movements: Extraocular movements intact.     Conjunctiva/sclera: Conjunctivae normal.  Cardiovascular:     Rate and Rhythm: Normal rate and regular rhythm.     Heart sounds: Normal heart sounds. No murmur heard. Pulmonary:     Effort: Pulmonary effort is normal.     Breath sounds: Normal breath sounds. No wheezing.  Musculoskeletal:     Right lower leg: No edema.     Left lower leg: No edema.  Skin:    General: Skin is warm and dry.  Neurological:     General: No focal deficit present.     Mental Status: She is alert and oriented to person, place, and time.  Psychiatric:        Mood and Affect: Mood normal.        Behavior: Behavior normal.       Assessment & Plan:  Encounter to establish care  Mixed hyperlipidemia Assessment & Plan: Stable. Continue with current management, lipid panel today. Will add statin as indicated.  Discussed healthy diet and lifestyle.   Orders: -     Lipid panel  Generalized abdominal pain Assessment & Plan: Symptoms have resolved at this time. Normal abdominal exam. Patient advised to start new medication. She is to follow up in 2-4 weeks if symptoms persist.      Return in about 3 months (around 10/23/2023) for phys with Pap .   Michelle Barr Michelle Brunkhorst, PA-C

## 2023-07-23 NOTE — ED Notes (Signed)
Pt given crackers and fluids for PO challenge.

## 2023-07-23 NOTE — Assessment & Plan Note (Signed)
 Stable. Continue with current management, lipid panel today. Will add statin as indicated.  Discussed healthy diet and lifestyle.

## 2023-07-24 ENCOUNTER — Ambulatory Visit: Payer: Self-pay | Admitting: Physician Assistant

## 2023-07-24 LAB — LIPID PANEL
Chol/HDL Ratio: 5.9 ratio — ABNORMAL HIGH (ref 0.0–4.4)
Cholesterol, Total: 205 mg/dL — ABNORMAL HIGH (ref 100–199)
HDL: 35 mg/dL — ABNORMAL LOW (ref >39)
LDL Chol Calc (NIH): 120 mg/dL — ABNORMAL HIGH (ref 0–99)
Triglycerides: 282 mg/dL — ABNORMAL HIGH (ref 0–149)
VLDL Cholesterol Cal: 50 mg/dL — ABNORMAL HIGH (ref 5–40)

## 2023-10-23 ENCOUNTER — Encounter: Payer: PRIVATE HEALTH INSURANCE | Admitting: Physician Assistant

## 2023-11-24 ENCOUNTER — Emergency Department (HOSPITAL_COMMUNITY)
Admission: EM | Admit: 2023-11-24 | Discharge: 2023-11-24 | Disposition: A | Discharge status: Home / Self Care | Attending: Emergency Medicine | Admitting: Emergency Medicine

## 2023-11-24 ENCOUNTER — Other Ambulatory Visit: Payer: Self-pay

## 2023-11-24 ENCOUNTER — Emergency Department (HOSPITAL_COMMUNITY)

## 2023-11-24 ENCOUNTER — Encounter (HOSPITAL_COMMUNITY): Payer: Self-pay

## 2023-11-24 DIAGNOSIS — D72829 Elevated white blood cell count, unspecified: Secondary | ICD-10-CM | POA: Insufficient documentation

## 2023-11-24 DIAGNOSIS — R42 Dizziness and giddiness: Secondary | ICD-10-CM | POA: Insufficient documentation

## 2023-11-24 DIAGNOSIS — R519 Headache, unspecified: Secondary | ICD-10-CM | POA: Diagnosis not present

## 2023-11-24 DIAGNOSIS — R110 Nausea: Secondary | ICD-10-CM | POA: Diagnosis not present

## 2023-11-24 LAB — CBC WITH DIFFERENTIAL/PLATELET
Abs Immature Granulocytes: 0.05 K/uL (ref 0.00–0.07)
Basophils Absolute: 0 K/uL (ref 0.0–0.1)
Basophils Relative: 0 %
Eosinophils Absolute: 0.1 K/uL (ref 0.0–0.5)
Eosinophils Relative: 0 %
HCT: 39.4 % (ref 36.0–46.0)
Hemoglobin: 13 g/dL (ref 12.0–15.0)
Immature Granulocytes: 0 %
Lymphocytes Relative: 15 %
Lymphs Abs: 1.9 K/uL (ref 0.7–4.0)
MCH: 28.1 pg (ref 26.0–34.0)
MCHC: 33 g/dL (ref 30.0–36.0)
MCV: 85.1 fL (ref 80.0–100.0)
Monocytes Absolute: 0.3 K/uL (ref 0.1–1.0)
Monocytes Relative: 2 %
Neutro Abs: 10.1 K/uL — ABNORMAL HIGH (ref 1.7–7.7)
Neutrophils Relative %: 83 %
Platelets: 298 K/uL (ref 150–400)
RBC: 4.63 MIL/uL (ref 3.87–5.11)
RDW: 14.6 % (ref 11.5–15.5)
WBC: 12.4 K/uL — ABNORMAL HIGH (ref 4.0–10.5)
nRBC: 0 % (ref 0.0–0.2)

## 2023-11-24 LAB — URINALYSIS, ROUTINE W REFLEX MICROSCOPIC
Bacteria, UA: NONE SEEN
Bilirubin Urine: NEGATIVE
Glucose, UA: NEGATIVE mg/dL
Hgb urine dipstick: NEGATIVE
Ketones, ur: NEGATIVE mg/dL
Nitrite: NEGATIVE
Protein, ur: NEGATIVE mg/dL
Specific Gravity, Urine: 1.01 (ref 1.005–1.030)
pH: 7 (ref 5.0–8.0)

## 2023-11-24 LAB — LIPASE, BLOOD: Lipase: 28 U/L (ref 11–51)

## 2023-11-24 LAB — COMPREHENSIVE METABOLIC PANEL WITH GFR
ALT: 21 U/L (ref 0–44)
AST: 23 U/L (ref 15–41)
Albumin: 3.6 g/dL (ref 3.5–5.0)
Alkaline Phosphatase: 58 U/L (ref 38–126)
Anion gap: 14 (ref 5–15)
BUN: 13 mg/dL (ref 8–23)
CO2: 23 mmol/L (ref 22–32)
Calcium: 8.7 mg/dL — ABNORMAL LOW (ref 8.9–10.3)
Chloride: 102 mmol/L (ref 98–111)
Creatinine, Ser: 0.6 mg/dL (ref 0.44–1.00)
GFR, Estimated: >60 mL/min (ref >60)
Glucose, Bld: 120 mg/dL — ABNORMAL HIGH (ref 70–99)
Potassium: 3.6 mmol/L (ref 3.5–5.1)
Sodium: 139 mmol/L (ref 135–145)
Total Bilirubin: 0.6 mg/dL (ref 0.0–1.2)
Total Protein: 7.7 g/dL (ref 6.5–8.1)

## 2023-11-24 MED ORDER — MECLIZINE HCL 25 MG PO TABS: 25.0000 mg | ORAL_TABLET | Freq: Three times a day (TID) | ORAL | 0 refills | Status: AC | PRN

## 2023-11-24 MED ORDER — LACTATED RINGERS IV BOLUS
500.0000 mL | Freq: Once | INTRAVENOUS | Status: AC
  Administered 2023-11-24: 500 mL via INTRAVENOUS

## 2023-11-24 MED ORDER — MECLIZINE HCL 12.5 MG PO TABS
25.0000 mg | ORAL_TABLET | Freq: Once | ORAL | Status: AC
  Administered 2023-11-24: 25 mg via ORAL
  Filled 2023-11-24: qty 2

## 2023-11-24 NOTE — ED Triage Notes (Signed)
 Patient came in via [facility]. Patient complains of nausea and vomiting after getting dizzy. EMS administered 4 mg of zofran  and 100 ml of fluids in route. Pt is not diabetic. Cbg was 137.

## 2023-11-24 NOTE — ED Provider Notes (Signed)
 [place] EMERGENCY DEPARTMENT AT [facility] Provider Note   CSN: [id number] Arrival date & time: 11/24/23  1039     Patient presents with: Emesis   Michelle Barr is a 63 y.o. female.  She has history of high cholesterol, GERD and orthostatic hypotension.  States she woke up this morning with headache, she got a bed she got very dizzy described as everything was spinning and she vomited multiple times.  She laid on the ground and continued to have the spinning sensations over has been called EMS.  They gave her Zofran  and small amount of fluids she states while laying still she is still feels much better but when she sits up she still feels like everything is starting to spin.  Patient has been helps with history, states she has had this happen before but not associated with vomiting and she was told it was from standing up too quickly.  Patient denies any chest pain or shortness of breath, no abdominal pain, no diarrhea.  Denies any numbness tingling or weakness.  Patient's primary language is [place] but she does also speak [place] and husband is here to help with history as well.  I offered medical interpreter and they refused.    Emesis      Prior to Admission medications   Medication Sig Start Date End Date Taking? Authorizing Provider  meclizine  (ANTIVERT ) 25 MG tablet Take 1 tablet (25 mg total) by mouth 3 (three) times daily as needed for dizziness. 11/24/23  Yes Jerrod Damiano A, PA-C  acetaminophen  (TYLENOL  8 HOUR) 650 MG CR tablet Take 1 tablet (650 mg total) by mouth every 8 (eight) hours as needed for pain. 04/09/22   Sebastian Beverley NOVAK, MD  dicyclomine  (BENTYL ) 20 MG tablet Take 1 tablet (20 mg total) by mouth 2 (two) times daily. 07/23/23   Elnor Jayson LABOR, DO  pantoprazole  (PROTONIX ) 40 MG tablet Take 1 tablet (40 mg total) by mouth daily for 14 days. 07/23/23 08/06/23  Elnor Jayson LABOR, DO  rosuvastatin  (CRESTOR ) 5 MG tablet Take 1 tablet (5 mg total) by  mouth daily. Patient not taking: Reported on 07/23/2023 11/01/22   Sebastian Beverley NOVAK, MD    Allergies: Patient has no known allergies.    Review of Systems  Gastrointestinal:  Positive for vomiting.    Updated Vital Signs BP 128/71   Pulse 78   Temp 98.1 F (36.7 C) (Oral)   Resp 15   Ht 5' 2 (1.575 m)   Wt 72.6 kg   LMP 03/04/2006   SpO2 93%   BMI 29.26 kg/m   Physical Exam Vitals and nursing note reviewed.  Constitutional:      General: She is not in acute distress.    Appearance: She is well-developed.  HENT:     Head: Normocephalic and atraumatic.     Mouth/Throat:     Mouth: Mucous membranes are moist.  Eyes:     Extraocular Movements: Extraocular movements intact.     Conjunctiva/sclera: Conjunctivae normal.     Pupils: Pupils are equal, round, and reactive to light.     Comments: No nystagmus  Cardiovascular:     Rate and Rhythm: Normal rate and regular rhythm.     Heart sounds: No murmur heard. Pulmonary:     Effort: Pulmonary effort is normal. No respiratory distress.     Breath sounds: Normal breath sounds.  Abdominal:     Palpations: Abdomen is soft.     Tenderness: There  is no abdominal tenderness.  Musculoskeletal:        General: No swelling.     Cervical back: Neck supple.  Skin:    General: Skin is warm and dry.     Capillary Refill: Capillary refill takes less than 2 seconds.  Neurological:     General: No focal deficit present.     Mental Status: She is alert and oriented to person, place, and time.     Cranial Nerves: No cranial nerve deficit.     Sensory: No sensory deficit.     Motor: No weakness.     Coordination: Coordination normal.     Gait: Gait normal.  Psychiatric:        Mood and Affect: Mood normal.     (all labs ordered are listed, but only abnormal results are displayed) Labs Reviewed  CBC WITH DIFFERENTIAL/PLATELET - Abnormal; Notable for the following components:      Result Value   WBC 12.4 (*)    Neutro Abs 10.1  (*)    All other components within normal limits  COMPREHENSIVE METABOLIC PANEL WITH GFR - Abnormal; Notable for the following components:   Glucose, Bld 120 (*)    Calcium  8.7 (*)    All other components within normal limits  URINALYSIS, ROUTINE W REFLEX MICROSCOPIC - Abnormal; Notable for the following components:   APPearance HAZY (*)    Leukocytes,Ua SMALL (*)    All other components within normal limits  LIPASE, BLOOD    EKG: None  Radiology: CT Head Wo Contrast Result Date: 11/24/2023 CLINICAL DATA:  Headache, new onset (Age >= 51y) EXAM: CT HEAD WITHOUT CONTRAST TECHNIQUE: Contiguous axial images were obtained from the base of the skull through the vertex without intravenous contrast. RADIATION DOSE REDUCTION: This exam was performed according to the departmental dose-optimization program which includes automated exposure control, adjustment of the mA and/or kV according to patient size and/or use of iterative reconstruction technique. COMPARISON:  None Available. FINDINGS: Brain: No evidence of acute infarction, hemorrhage, hydrocephalus, extra-axial collection or mass lesion/mass effect. Vascular: Calcific atherosclerosis.  No hyperdense vessel. Skull: No acute fracture. Sinuses/Orbits: Clear sinuses.  No acute orbital findings. Other: No mastoid effusions. IMPRESSION: No evidence of acute intracranial abnormality. Electronically Signed   By: Gilmore GORMAN Molt M.D.   On: 11/24/2023 14:09     Procedures   Medications Ordered in the ED  meclizine  (ANTIVERT ) tablet 25 mg (25 mg Oral Given 11/24/23 1405)  lactated ringers  bolus 500 mL (0 mLs Intravenous Stopped 11/24/23 1434)                                    Medical Decision Making Differential diagnosis includes but not limited to BPPV, labyrinthitis, CVA, ICH, migraine, dehydration, UTI, other  ED course: Patient presents to the ER for evaluation of nausea from this morning accompanied by a sensation of spinning and  headache.  This occurred when she woke up.  She followed multiple times and then when she stood up from the floor fell like everything was spinning.  She has a headache.  She had no numbness tingling or weakness, no vision changes.  Neurologic exam is entirely normal.  I have low suspicion for central vertigo, given her age and complaint of a bad headache this morning CT ordered and is negative.  Her headache has fully resolved, she has had no more nausea or vomiting after the Zofran   administered by EMS.  After additional fluids and a dose of meclizine  her vertigo has now fully resolved she is able to sit up and stand without symptoms.  She has no dysmetria on exam.  I discussed with her and her husband that this is likely a peripheral vertigo.  She can take meclizine  as needed but follow-up with PCP.  They are given strict return precautions.  We discussed that if she has the vertigo and it is persistent or accompanied by any symptoms such as numbness, tingling, weakness or other worrisome symptoms they should come back to the ER right away.  I did consider MRI to evaluate for cerebellar stroke but do not feel this is indicated given the patient's reassuring exam, reassuring history and normalization of symptoms after treatment.  Amount and/or Complexity of Data Reviewed External Data Reviewed: labs and notes. Labs: ordered. Radiology: ordered and independent interpretation performed.    Details: CT head shows no intracranial hemorrhage, acute infarct or other acute findings.  I agree with radiology read        Final diagnoses:  Vertigo    ED Discharge Orders          Ordered    meclizine  (ANTIVERT ) 25 MG tablet  3 times daily PRN        11/24/23 1543               Suellen Sherran LABOR, PA-C 11/24/23 1548    Bernard Drivers, MD 11/25/23 1541

## 2023-11-24 NOTE — Discharge Instructions (Signed)
 Was a pleasure taking care of you today.  You were seen in the emergency room for dizziness.  The symptoms described are consistent with vertigo likely caused by problem with the ear.  Fortunately you had no signs of stroke and are feeling better after nausea medicine and medicine for vertigo called meclizine .  You can take this as needed for dizziness.  Come back to the ER if you have new or worsening symptoms.  Follow-up with your PCP.  If you have recurrent episodes you may need to follow-up with an ENT doctor.

## 2023-11-24 NOTE — ED Notes (Signed)
 Patient transported to CT

## 2023-11-26 ENCOUNTER — Inpatient Hospital Stay: Payer: PRIVATE HEALTH INSURANCE | Admitting: Physician Assistant
